# Patient Record
Sex: Female | Born: 1970 | Race: White | Hispanic: No | Marital: Married | Smoking: Never smoker
Health system: Southern US, Community
[De-identification: ages and names within clinical notes are randomized; demographics above are authoritative.]

## PROBLEM LIST (undated history)

## (undated) DIAGNOSIS — D649 Anemia, unspecified: Secondary | ICD-10-CM

## (undated) HISTORY — PX: ABDOMINAL HYSTERECTOMY: SHX81

---

## 2000-11-08 HISTORY — PX: DILATION AND CURETTAGE OF UTERUS: SHX78

## 2017-10-24 ENCOUNTER — Other Ambulatory Visit: Payer: Self-pay | Admitting: Family Medicine

## 2017-10-24 DIAGNOSIS — Z1231 Encounter for screening mammogram for malignant neoplasm of breast: Secondary | ICD-10-CM

## 2017-11-16 ENCOUNTER — Encounter: Payer: Self-pay | Admitting: Radiology

## 2017-11-16 ENCOUNTER — Ambulatory Visit
Admission: RE | Admit: 2017-11-16 | Discharge: 2017-11-16 | Disposition: A | Discharge status: Home / Self Care | Payer: BLUE CROSS/BLUE SHIELD | Source: Ambulatory Visit | Attending: Family Medicine | Admitting: Family Medicine

## 2017-11-16 DIAGNOSIS — R928 Other abnormal and inconclusive findings on diagnostic imaging of breast: Secondary | ICD-10-CM | POA: Insufficient documentation

## 2017-11-16 DIAGNOSIS — N6489 Other specified disorders of breast: Secondary | ICD-10-CM | POA: Insufficient documentation

## 2017-11-16 DIAGNOSIS — Z1231 Encounter for screening mammogram for malignant neoplasm of breast: Secondary | ICD-10-CM | POA: Diagnosis not present

## 2017-11-23 ENCOUNTER — Other Ambulatory Visit: Payer: Self-pay | Admitting: Family Medicine

## 2017-11-23 DIAGNOSIS — R928 Other abnormal and inconclusive findings on diagnostic imaging of breast: Secondary | ICD-10-CM

## 2017-11-23 DIAGNOSIS — N6489 Other specified disorders of breast: Secondary | ICD-10-CM

## 2017-11-24 ENCOUNTER — Ambulatory Visit
Admission: RE | Admit: 2017-11-24 | Discharge: 2017-11-24 | Disposition: A | Discharge status: Home / Self Care | Payer: BLUE CROSS/BLUE SHIELD | Source: Ambulatory Visit | Attending: Family Medicine | Admitting: Family Medicine

## 2017-11-24 DIAGNOSIS — N6489 Other specified disorders of breast: Secondary | ICD-10-CM

## 2017-11-24 DIAGNOSIS — R928 Other abnormal and inconclusive findings on diagnostic imaging of breast: Secondary | ICD-10-CM

## 2020-12-02 ENCOUNTER — Other Ambulatory Visit: Payer: Self-pay | Admitting: Family Medicine

## 2020-12-02 DIAGNOSIS — R102 Pelvic and perineal pain: Secondary | ICD-10-CM

## 2020-12-05 ENCOUNTER — Ambulatory Visit
Admission: RE | Admit: 2020-12-05 | Discharge: 2020-12-05 | Disposition: A | Discharge status: Home / Self Care | Payer: BC Managed Care – PPO | Source: Ambulatory Visit | Attending: Family Medicine | Admitting: Family Medicine

## 2020-12-05 ENCOUNTER — Ambulatory Visit
Admission: RE | Admit: 2020-12-05 | Discharge: 2020-12-05 | Disposition: A | Discharge status: Home / Self Care | Payer: BLUE CROSS/BLUE SHIELD | Source: Ambulatory Visit | Attending: Family Medicine | Admitting: Family Medicine

## 2020-12-05 ENCOUNTER — Other Ambulatory Visit: Payer: Self-pay

## 2020-12-05 DIAGNOSIS — R102 Pelvic and perineal pain: Secondary | ICD-10-CM | POA: Diagnosis not present

## 2021-01-05 ENCOUNTER — Other Ambulatory Visit: Payer: Self-pay

## 2021-01-05 ENCOUNTER — Ambulatory Visit
Admission: RE | Admit: 2021-01-05 | Discharge: 2021-01-05 | Disposition: A | Discharge status: Home / Self Care | Payer: BC Managed Care – PPO | Source: Ambulatory Visit | Attending: Obstetrics and Gynecology | Admitting: Obstetrics and Gynecology

## 2021-01-05 DIAGNOSIS — N920 Excessive and frequent menstruation with regular cycle: Secondary | ICD-10-CM | POA: Insufficient documentation

## 2021-01-05 DIAGNOSIS — O034 Incomplete spontaneous abortion without complication: Secondary | ICD-10-CM | POA: Diagnosis present

## 2021-01-05 DIAGNOSIS — D259 Leiomyoma of uterus, unspecified: Secondary | ICD-10-CM | POA: Diagnosis not present

## 2021-01-05 MED ORDER — SODIUM CHLORIDE 0.9 % IV SOLN
300.0000 mg | INTRAVENOUS | Status: DC
  Administered 2021-01-05: 300 mg via INTRAVENOUS
  Filled 2021-01-05: qty 15

## 2021-01-06 ENCOUNTER — Other Ambulatory Visit: Payer: Self-pay | Admitting: Obstetrics and Gynecology

## 2021-01-12 ENCOUNTER — Other Ambulatory Visit: Payer: Self-pay

## 2021-01-12 ENCOUNTER — Ambulatory Visit: Payer: BC Managed Care – PPO

## 2021-01-12 ENCOUNTER — Ambulatory Visit
Admission: RE | Admit: 2021-01-12 | Discharge: 2021-01-12 | Disposition: A | Discharge status: Home / Self Care | Payer: BC Managed Care – PPO | Source: Ambulatory Visit | Attending: Obstetrics and Gynecology | Admitting: Obstetrics and Gynecology

## 2021-01-12 DIAGNOSIS — D509 Iron deficiency anemia, unspecified: Secondary | ICD-10-CM | POA: Diagnosis present

## 2021-01-12 MED ORDER — SODIUM CHLORIDE 0.9 % IV SOLN
300.0000 mg | INTRAVENOUS | Status: DC
  Administered 2021-01-12: 300 mg via INTRAVENOUS
  Filled 2021-01-12: qty 15

## 2021-01-12 NOTE — H&P (Signed)
Debra James is a 50 y.o. female here forTAH and bilateral salpingectomy  2 month h/o of lower abd mass noted . + increase heavy menses . +/- clots  Pain / cramping sometimes will double her over.  Cbc 12/02/20: Hct 29.6 , mcv 69 Some dyspareunia   EMBX : 2/22 proliferative   Pap : neg    hemoglobin last 8.7 ( since she has received iron transfusions ) Ferritin 3  U/s  EXAM: TRANSABDOMINAL AND TRANSVAGINAL ULTRASOUND OF PELVIS  TECHNIQUE: Both transabdominal and transvaginal ultrasound examinations of the pelvis were performed. Transabdominal technique was performed for global imaging of the pelvis including uterus, ovaries, adnexal regions, and pelvic cul-de-sac. It was necessary to proceed with endovaginal exam following the transabdominal exam to visualize the endometrium.  COMPARISON: None  FINDINGS: Uterus  Measurements: 17.8 x 14.2 x 14.0 cm = volume: 1851 mL. Anteverted. Enlarged and heterogeneous. Large heterogeneous mass identified at the mid upper uterine segments posteriorly 12.2 x 11.3 x 12.5 cm consistent with a large transmural leiomyoma. Displacement and compression of the remainder of the uterus including endometrial complex anteriorly.  Endometrium  Nonvisualized due to compression and displacement by large uterine mass  Right ovary  Measurements: 6.5 x 4.1 x 6.2 cm = volume: 86 mL. Cyst within RIGHT ovary 5.5 x 3.5 x 5.8 cm containing a thin septation. No definite mural nodularity.  Left ovary  Measurements: 3.2 x 1.4 x 2.6 cm = volume: 5.8 mL. Normal morphology without mass.  Other findings  No free pelvic fluid. No other pelvic masses.    Past Medical History:  has a past medical history of Allergic state.  Past Surgical History:  has a past surgical history that includes Dilation and curettage of uterus. Family History: family history includes Diabetes type II in her mother; Epilepsy in her brother; Heart failure in her  maternal grandmother, paternal grandfather, and paternal grandmother; High blood pressure (Hypertension) in her brother; Hyperlipidemia (Elevated cholesterol) in her father; Myocardial Infarction (Heart attack) in her maternal grandfather; Tuberculosis in her paternal grandfather. Social History:  reports that she has never smoked. She has never used smokeless tobacco. She reports current alcohol use. She reports that she does not use drugs. OB/GYN History:          OB History    Gravida  5   Para  4   Term      Preterm      AB  1   Living  4     SAB      IAB      Ectopic      Molar      Multiple      Live Births  4          Allergies: is allergic to penicillin. Medications:  Current Outpatient Medications:  .  tranexamic acid (LYSTEDA) 650 mg tablet, Take 2 tablets (1,300 mg total) by mouth 3 (three) times daily Take for a maximum of 5 days during monthly menstruation., Disp: 30 tablet, Rfl: 3  Review of Systems: General:                      No fatigue or weight loss Eyes:                           No vision changes Ears:  No hearing difficulty Respiratory:                No cough or shortness of breath Pulmonary:                  No asthma or shortness of breath Cardiovascular:           No chest pain, palpitations, dyspnea on exertion Gastrointestinal:          No abdominal bloating, chronic diarrhea, constipations, masses, pain or hematochezia Genitourinary:             No hematuria, dysuria, abnormal vaginal discharge, pelvic pain, Menometrorrhagia Lymphatic:                   No swollen lymph nodes Musculoskeletal:         No muscle weakness Neurologic:                  No extremity weakness, syncope, seizure disorder Psychiatric:                  No history of depression, delusions or suicidal/homicidal ideation    Exam:      Vitals:   01/12/21   BP: 110/71  Pulse: 89    Body mass index is 28.51  kg/m.  WDWN white/ female in NAD   Lungs: CTA  CV : RRR without murmur   Neck:  no thyromegaly Abdomen: soft , no mass, normal active bowel sounds,  non-tender, no rebound tenderness, uterus to umbilicus   Pelvic: tanner stage 5 ,  External genitalia: vulva /labia no lesions Urethra: no prolapse Vagina: normal physiologic d/c Cervix: high ,no lesions, no cervical motion tenderness   Uterus:20 week in size Adnexa: no mass,  non-tender   Rectovaginal:   Impression:   The primary encounter diagnosis was Intramural leiomyoma of uterus. Diagnoses of Ovarian cyst, right, Menorrhagia with irregular cycle, Routine cervical smear, and Iron deficiency anemia due to chronic blood loss were also pertinent to this visit.    Plan:   I spoke to her about treatment option TAH vs [place] ( done by IR - she mentioned ). Lupron injection to reduce volume then surgery . After consideration she elects for TAH / bilateral salpingectomy . Possible right ovarian cystectomy   She has received iron transfusions   risk of the procedure have been discussed with the patient  ( See [facility] notes)         Caroline Sauger, MD

## 2021-01-20 ENCOUNTER — Other Ambulatory Visit
Admission: RE | Admit: 2021-01-20 | Discharge: 2021-01-20 | Disposition: A | Discharge status: Home / Self Care | Payer: BC Managed Care – PPO | Source: Ambulatory Visit

## 2021-01-20 ENCOUNTER — Other Ambulatory Visit: Payer: Self-pay

## 2021-01-20 ENCOUNTER — Ambulatory Visit
Admission: RE | Admit: 2021-01-20 | Discharge: 2021-01-20 | Disposition: A | Discharge status: Home / Self Care | Payer: BC Managed Care – PPO | Source: Ambulatory Visit | Attending: Obstetrics and Gynecology | Admitting: Obstetrics and Gynecology

## 2021-01-20 DIAGNOSIS — D500 Iron deficiency anemia secondary to blood loss (chronic): Secondary | ICD-10-CM | POA: Diagnosis not present

## 2021-01-20 HISTORY — DX: Anemia, unspecified: D64.9

## 2021-01-20 MED ORDER — SODIUM CHLORIDE 0.9 % IV SOLN
300.0000 mg | Freq: Once | INTRAVENOUS | Status: AC
  Administered 2021-01-20: 300 mg via INTRAVENOUS
  Filled 2021-01-20: qty 15

## 2021-01-20 MED ORDER — SODIUM CHLORIDE 0.9 % IV SOLN
300.0000 mg | Freq: Once | INTRAVENOUS | Status: DC
  Filled 2021-01-20 (×2): qty 15

## 2021-01-20 NOTE — Patient Instructions (Signed)
Your procedure is scheduled on: Thursday January 29, 2021. Report to Day Surgery inside [facility] 2nd floor (stop by Admissions desk first before getting on Elevator). To find out your arrival time please call [phone] between 1PM - 3PM on Wednesday January 28, 2021.  Remember: Instructions that are not followed completely may result in serious medical risk,  up to and including death, or upon the discretion of your surgeon and anesthesiologist your  surgery may need to be rescheduled.     _X__ 1. Do not eat food after midnight the night before your procedure.                 No chewing gum or hard candies. You may drink clear liquids up to 2 hours                 before you are scheduled to arrive for your surgery- DO not drink clear                 liquids within 2 hours of the start of your surgery.                 Clear Liquids include:  water, apple juice without pulp, clear Gatorade, G2 or                  Gatorade Zero (avoid Red/Purple/Blue), Black Coffee or Tea (Do not add                 anything to coffee or tea).  __X__2.  On the morning of surgery brush your teeth with toothpaste and water, you                may rinse your mouth with mouthwash if you wish.  Do not swallow any toothpaste of mouthwash.     _X__ 3.  No Alcohol for 24 hours before or after surgery.   _X__ 4.  Do Not Smoke or use e-cigarettes For 24 Hours Prior to Your Surgery.                 Do not use any chewable tobacco products for at least 6 hours prior to                 Surgery.  _X__  5.  Do not use any recreational drugs (marijuana, cocaine, heroin, ecstasy, MDMA or other)                For at least one week prior to your surgery.  Combination of these drugs with anesthesia                May have life threatening results.  __X__ 6.  Notify your doctor if there is any change in your medical condition      (cold, fever, infections).     Do not wear jewelry, make-up,  hairpins, clips or nail polish. Do not wear lotions, powders, or perfumes. You may wear deodorant. Do not shave 48 hours prior to surgery. Men may shave face and neck. Do not bring valuables to the hospital.    [facility] is not responsible for any belongings or valuables.  Contacts, dentures or bridgework may not be worn into surgery. Leave your suitcase in the car. After surgery it may be brought to your room. For patients admitted to the hospital, discharge time is determined by your treatment team.   Patients discharged the day of surgery will not be allowed to drive  home.   Make arrangements for someone to be with you for the first 24 hours of your Same Day Discharge.    __X__ Take these medicines the morning of surgery with A SIP OF WATER:    1. None   2.   3.   4.  5.  6.  ____ Fleet Enema (as directed)   __X__ Use CHG Soap (or wipes) as directed  ____ Use Benzoyl Peroxide Gel as instructed  __X__ Use inhalers on the day of surgery  fluticasone (FLONASE) 50 MCG/ACT nasal spray  ____ Stop metformin 2 days prior to surgery    __X__ Stop Anti-inflammatories such as ibuprofen (ADVIL), Aleve, naproxen, aspirin and or BC powders.   __X__ Stop supplements until after surgery.    ____  Do not start any herbal supplements before your procedure.    If you have any questions regarding your pre-procedure instructions,  Please call Pre-admit Testing at [phone].

## 2021-01-27 ENCOUNTER — Other Ambulatory Visit: Payer: BC Managed Care – PPO

## 2021-01-27 ENCOUNTER — Other Ambulatory Visit: Payer: Self-pay

## 2021-01-27 ENCOUNTER — Other Ambulatory Visit
Admission: RE | Admit: 2021-01-27 | Discharge: 2021-01-27 | Disposition: A | Discharge status: Home / Self Care | Payer: BC Managed Care – PPO | Source: Ambulatory Visit | Attending: Obstetrics and Gynecology | Admitting: Obstetrics and Gynecology

## 2021-01-27 DIAGNOSIS — Z01812 Encounter for preprocedural laboratory examination: Secondary | ICD-10-CM | POA: Insufficient documentation

## 2021-01-27 DIAGNOSIS — Z20822 Contact with and (suspected) exposure to covid-19: Secondary | ICD-10-CM | POA: Insufficient documentation

## 2021-01-27 LAB — SARS CORONAVIRUS 2 (TAT 6-24 HRS): SARS Coronavirus 2: NEGATIVE

## 2021-01-27 LAB — BASIC METABOLIC PANEL
Anion gap: 8 (ref 5–15)
BUN: 14 mg/dL (ref 6–20)
CO2: 25 mmol/L (ref 22–32)
Calcium: 8.9 mg/dL (ref 8.9–10.3)
Chloride: 103 mmol/L (ref 98–111)
Creatinine, Ser: 0.83 mg/dL (ref 0.44–1.00)
GFR, Estimated: >60 mL/min (ref >60)
Glucose, Bld: 76 mg/dL (ref 70–99)
Potassium: 3.7 mmol/L (ref 3.5–5.1)
Sodium: 136 mmol/L (ref 135–145)

## 2021-01-27 LAB — TYPE AND SCREEN
ABO/RH(D): A NEG
Antibody Screen: NEGATIVE

## 2021-01-27 LAB — CBC
HCT: 36 % (ref 36.0–46.0)
Hemoglobin: 10.7 g/dL — ABNORMAL LOW (ref 12.0–15.0)
MCH: 22.1 pg — ABNORMAL LOW (ref 26.0–34.0)
MCHC: 29.7 g/dL — ABNORMAL LOW (ref 30.0–36.0)
MCV: 74.4 fL — ABNORMAL LOW (ref 80.0–100.0)
Platelets: 453 10*3/uL — ABNORMAL HIGH (ref 150–400)
RBC: 4.84 MIL/uL (ref 3.87–5.11)
RDW: 27.2 % — ABNORMAL HIGH (ref 11.5–15.5)
WBC: 6.9 10*3/uL (ref 4.0–10.5)
nRBC: 0 % (ref 0.0–0.2)

## 2021-01-29 ENCOUNTER — Other Ambulatory Visit: Payer: Self-pay

## 2021-01-29 ENCOUNTER — Inpatient Hospital Stay
Admission: RE | Admit: 2021-01-29 | Discharge: 2021-01-31 | DRG: 743 | Disposition: A | Discharge status: Home / Self Care | Payer: BC Managed Care – PPO | Attending: Obstetrics and Gynecology | Admitting: Obstetrics and Gynecology

## 2021-01-29 ENCOUNTER — Encounter
Admission: RE | Disposition: A | Discharge status: Home / Self Care | Payer: Self-pay | Source: Home / Self Care | Attending: Obstetrics and Gynecology

## 2021-01-29 ENCOUNTER — Inpatient Hospital Stay: Payer: BC Managed Care – PPO

## 2021-01-29 ENCOUNTER — Encounter: Payer: Self-pay | Admitting: Obstetrics and Gynecology

## 2021-01-29 DIAGNOSIS — D251 Intramural leiomyoma of uterus: Principal | ICD-10-CM | POA: Diagnosis present

## 2021-01-29 DIAGNOSIS — D500 Iron deficiency anemia secondary to blood loss (chronic): Secondary | ICD-10-CM | POA: Diagnosis present

## 2021-01-29 DIAGNOSIS — N83202 Unspecified ovarian cyst, left side: Secondary | ICD-10-CM | POA: Diagnosis present

## 2021-01-29 DIAGNOSIS — N920 Excessive and frequent menstruation with regular cycle: Secondary | ICD-10-CM | POA: Diagnosis present

## 2021-01-29 DIAGNOSIS — Z20822 Contact with and (suspected) exposure to covid-19: Secondary | ICD-10-CM | POA: Diagnosis present

## 2021-01-29 DIAGNOSIS — Z9889 Other specified postprocedural states: Secondary | ICD-10-CM

## 2021-01-29 HISTORY — PX: OVARIAN CYST REMOVAL: SHX89

## 2021-01-29 HISTORY — PX: HYSTERECTOMY ABDOMINAL WITH SALPINGECTOMY: SHX6725

## 2021-01-29 LAB — ABO/RH: ABO/RH(D): A NEG

## 2021-01-29 LAB — POCT PREGNANCY, URINE
Preg Test, Ur: NEGATIVE
Preg Test, Ur: NEGATIVE
Urine-Other: NEGATIVE

## 2021-01-29 SURGERY — HYSTERECTOMY, TOTAL, ABDOMINAL, WITH SALPINGECTOMY
Anesthesia: General | Laterality: Left

## 2021-01-29 MED ORDER — ROCURONIUM BROMIDE 100 MG/10ML IV SOLN
INTRAVENOUS | Status: DC | PRN
  Administered 2021-01-29 (×3): 50 mg via INTRAVENOUS

## 2021-01-29 MED ORDER — LIDOCAINE HCL (PF) 2 % IJ SOLN
INTRAMUSCULAR | Status: AC
  Filled 2021-01-29: qty 5

## 2021-01-29 MED ORDER — PHENYLEPHRINE HCL (PRESSORS) 10 MG/ML IV SOLN
INTRAVENOUS | Status: AC
  Filled 2021-01-29: qty 1

## 2021-01-29 MED ORDER — BUPIVACAINE HCL (PF) 0.5 % IJ SOLN
INTRAMUSCULAR | Status: AC
  Filled 2021-01-29: qty 30

## 2021-01-29 MED ORDER — NALOXONE HCL 0.4 MG/ML IJ SOLN
0.4000 mg | INTRAMUSCULAR | Status: DC | PRN
  Filled 2021-01-29: qty 1

## 2021-01-29 MED ORDER — DEXAMETHASONE SODIUM PHOSPHATE 10 MG/ML IJ SOLN
INTRAMUSCULAR | Status: AC
  Filled 2021-01-29: qty 1

## 2021-01-29 MED ORDER — SODIUM CHLORIDE 0.9% FLUSH: 9.0000 mL | INTRAVENOUS | Status: DC | PRN

## 2021-01-29 MED ORDER — ONDANSETRON HCL 4 MG/2ML IJ SOLN: 4.0000 mg | Freq: Once | INTRAMUSCULAR | Status: DC | PRN

## 2021-01-29 MED ORDER — GABAPENTIN 300 MG PO CAPS: 300.0000 mg | ORAL_CAPSULE | ORAL | Status: AC

## 2021-01-29 MED ORDER — LIDOCAINE HCL (CARDIAC) PF 100 MG/5ML IV SOSY
PREFILLED_SYRINGE | INTRAVENOUS | Status: DC | PRN
  Administered 2021-01-29: 80 mg via INTRAVENOUS

## 2021-01-29 MED ORDER — PHENYLEPHRINE HCL (PRESSORS) 10 MG/ML IV SOLN
INTRAVENOUS | Status: DC | PRN
  Administered 2021-01-29 (×2): 50 ug via INTRAVENOUS
  Administered 2021-01-29 (×2): 100 ug via INTRAVENOUS

## 2021-01-29 MED ORDER — FENTANYL CITRATE (PF) 100 MCG/2ML IJ SOLN
INTRAMUSCULAR | Status: AC
  Filled 2021-01-29: qty 2

## 2021-01-29 MED ORDER — SODIUM CHLORIDE (PF) 0.9 % IJ SOLN
INTRAMUSCULAR | Status: AC
  Filled 2021-01-29: qty 50

## 2021-01-29 MED ORDER — CEFAZOLIN SODIUM-DEXTROSE 2-4 GM/100ML-% IV SOLN
INTRAVENOUS | Status: AC
  Filled 2021-01-29: qty 100

## 2021-01-29 MED ORDER — ONDANSETRON HCL 4 MG/2ML IJ SOLN
INTRAMUSCULAR | Status: AC
  Filled 2021-01-29: qty 2

## 2021-01-29 MED ORDER — IBUPROFEN 800 MG PO TABS: 800.0000 mg | ORAL_TABLET | Freq: Four times a day (QID) | ORAL | Status: DC

## 2021-01-29 MED ORDER — PROPOFOL 500 MG/50ML IV EMUL
INTRAVENOUS | Status: DC | PRN
  Administered 2021-01-29: 150 ug/kg/min via INTRAVENOUS

## 2021-01-29 MED ORDER — CEFAZOLIN SODIUM-DEXTROSE 2-4 GM/100ML-% IV SOLN
2.0000 g | Freq: Once | INTRAVENOUS | Status: AC
  Administered 2021-01-29: 2 g via INTRAVENOUS

## 2021-01-29 MED ORDER — ROCURONIUM BROMIDE 10 MG/ML (PF) SYRINGE
PREFILLED_SYRINGE | INTRAVENOUS | Status: AC
  Filled 2021-01-29: qty 10

## 2021-01-29 MED ORDER — ONDANSETRON HCL 4 MG/2ML IJ SOLN
INTRAMUSCULAR | Status: DC | PRN
  Administered 2021-01-29: 4 mg via INTRAVENOUS

## 2021-01-29 MED ORDER — PROPOFOL 10 MG/ML IV BOLUS
INTRAVENOUS | Status: AC
  Filled 2021-01-29: qty 20

## 2021-01-29 MED ORDER — DEXAMETHASONE SODIUM PHOSPHATE 10 MG/ML IJ SOLN
INTRAMUSCULAR | Status: DC | PRN
  Administered 2021-01-29: 10 mg via INTRAVENOUS

## 2021-01-29 MED ORDER — DIPHENHYDRAMINE HCL 50 MG/ML IJ SOLN: 12.5000 mg | Freq: Four times a day (QID) | INTRAMUSCULAR | Status: DC | PRN

## 2021-01-29 MED ORDER — POVIDONE-IODINE 10 % EX SWAB
2.0000 "application " | Freq: Once | CUTANEOUS | Status: AC
  Administered 2021-01-29: 2 via TOPICAL

## 2021-01-29 MED ORDER — ACETAMINOPHEN 10 MG/ML IV SOLN: 1000.0000 mg | Freq: Once | INTRAVENOUS | Status: DC | PRN

## 2021-01-29 MED ORDER — SODIUM CHLORIDE FLUSH 0.9 % IV SOLN
INTRAVENOUS | Status: DC | PRN
  Administered 2021-01-29: 80 mL

## 2021-01-29 MED ORDER — FAMOTIDINE 20 MG PO TABS
ORAL_TABLET | ORAL | Status: AC
  Administered 2021-01-29: 20 mg via ORAL
  Filled 2021-01-29: qty 1

## 2021-01-29 MED ORDER — ACETAMINOPHEN 500 MG PO TABS
ORAL_TABLET | ORAL | Status: AC
  Filled 2021-01-29: qty 2

## 2021-01-29 MED ORDER — ONDANSETRON HCL 4 MG PO TABS: 4.0000 mg | ORAL_TABLET | Freq: Four times a day (QID) | ORAL | Status: DC | PRN

## 2021-01-29 MED ORDER — KETOROLAC TROMETHAMINE 30 MG/ML IJ SOLN
30.0000 mg | Freq: Four times a day (QID) | INTRAMUSCULAR | Status: DC
  Administered 2021-01-29 – 2021-01-30 (×3): 30 mg via INTRAVENOUS
  Filled 2021-01-29 (×3): qty 1

## 2021-01-29 MED ORDER — ONDANSETRON HCL 4 MG/2ML IJ SOLN: 4.0000 mg | Freq: Four times a day (QID) | INTRAMUSCULAR | Status: DC | PRN

## 2021-01-29 MED ORDER — PROPOFOL 10 MG/ML IV BOLUS
INTRAVENOUS | Status: DC | PRN
Start: 2021-01-29 — End: 2021-01-29
  Administered 2021-01-29: 150 mg via INTRAVENOUS

## 2021-01-29 MED ORDER — MIDAZOLAM HCL 2 MG/2ML IJ SOLN
INTRAMUSCULAR | Status: DC | PRN
  Administered 2021-01-29: 2 mg via INTRAVENOUS

## 2021-01-29 MED ORDER — ACETAMINOPHEN 500 MG PO TABS
1000.0000 mg | ORAL_TABLET | Freq: Four times a day (QID) | ORAL | Status: DC
  Administered 2021-01-30 (×3): 1000 mg via ORAL
  Filled 2021-01-29 (×3): qty 2

## 2021-01-29 MED ORDER — CEFAZOLIN (ANCEF) 1 G IV SOLR: 1.0000 g | INTRAVENOUS | Status: DC

## 2021-01-29 MED ORDER — CHLORHEXIDINE GLUCONATE 0.12 % MT SOLN
OROMUCOSAL | Status: AC
  Filled 2021-01-29: qty 15

## 2021-01-29 MED ORDER — PROPOFOL 500 MG/50ML IV EMUL
INTRAVENOUS | Status: AC
  Filled 2021-01-29: qty 50

## 2021-01-29 MED ORDER — LACTATED RINGERS IV SOLN: INTRAVENOUS | Status: DC

## 2021-01-29 MED ORDER — FENTANYL CITRATE (PF) 100 MCG/2ML IJ SOLN
25.0000 ug | INTRAMUSCULAR | Status: DC | PRN
  Administered 2021-01-29: 50 ug via INTRAVENOUS
  Administered 2021-01-29 (×2): 25 ug via INTRAVENOUS

## 2021-01-29 MED ORDER — ORAL CARE MOUTH RINSE: 15.0000 mL | Freq: Once | OROMUCOSAL | Status: AC

## 2021-01-29 MED ORDER — OXYCODONE HCL 5 MG PO TABS: 5.0000 mg | ORAL_TABLET | Freq: Once | ORAL | Status: DC | PRN

## 2021-01-29 MED ORDER — GABAPENTIN 300 MG PO CAPS
ORAL_CAPSULE | ORAL | Status: AC
  Administered 2021-01-29: 300 mg via ORAL
  Filled 2021-01-29: qty 1

## 2021-01-29 MED ORDER — CHLORHEXIDINE GLUCONATE 0.12 % MT SOLN
15.0000 mL | Freq: Once | OROMUCOSAL | Status: AC
  Administered 2021-01-29: 15 mL via OROMUCOSAL

## 2021-01-29 MED ORDER — MORPHINE SULFATE 1 MG/ML IV SOLN PCA
INTRAVENOUS | Status: DC
Start: 2021-01-29 — End: 2021-01-30
  Administered 2021-01-29: 2 mg via INTRAVENOUS
  Administered 2021-01-30: 3 mg via INTRAVENOUS
  Filled 2021-01-29 (×2): qty 30

## 2021-01-29 MED ORDER — BUPIVACAINE LIPOSOME 1.3 % IJ SUSP
INTRAMUSCULAR | Status: AC
  Filled 2021-01-29: qty 20

## 2021-01-29 MED ORDER — FENTANYL CITRATE (PF) 100 MCG/2ML IJ SOLN
INTRAMUSCULAR | Status: AC
  Administered 2021-01-29: 25 ug via INTRAVENOUS
  Filled 2021-01-29: qty 2

## 2021-01-29 MED ORDER — MIDAZOLAM HCL 2 MG/2ML IJ SOLN
INTRAMUSCULAR | Status: AC
  Filled 2021-01-29: qty 2

## 2021-01-29 MED ORDER — SUGAMMADEX SODIUM 200 MG/2ML IV SOLN
INTRAVENOUS | Status: DC | PRN
  Administered 2021-01-29: 200 mg via INTRAVENOUS

## 2021-01-29 MED ORDER — ACETAMINOPHEN 500 MG PO TABS
1000.0000 mg | ORAL_TABLET | Freq: Four times a day (QID) | ORAL | Status: DC
  Administered 2021-01-29 (×2): 1000 mg via ORAL
  Filled 2021-01-29 (×2): qty 2

## 2021-01-29 MED ORDER — OXYCODONE HCL 5 MG/5ML PO SOLN: 5.0000 mg | Freq: Once | ORAL | Status: DC | PRN

## 2021-01-29 MED ORDER — KETOROLAC TROMETHAMINE 30 MG/ML IJ SOLN
INTRAMUSCULAR | Status: AC
  Filled 2021-01-29: qty 1

## 2021-01-29 MED ORDER — GABAPENTIN 300 MG PO CAPS
300.0000 mg | ORAL_CAPSULE | Freq: Every day | ORAL | Status: DC
  Filled 2021-01-29: qty 1

## 2021-01-29 MED ORDER — FAMOTIDINE 20 MG PO TABS: 20.0000 mg | ORAL_TABLET | Freq: Once | ORAL | Status: AC

## 2021-01-29 MED ORDER — KETOROLAC TROMETHAMINE 30 MG/ML IJ SOLN
INTRAMUSCULAR | Status: DC | PRN
  Administered 2021-01-29: 30 mg via INTRAVENOUS

## 2021-01-29 MED ORDER — FENTANYL CITRATE (PF) 100 MCG/2ML IJ SOLN
INTRAMUSCULAR | Status: DC | PRN
  Administered 2021-01-29 (×2): 50 ug via INTRAVENOUS

## 2021-01-29 MED ORDER — DIPHENHYDRAMINE HCL 12.5 MG/5ML PO ELIX
12.5000 mg | ORAL_SOLUTION | Freq: Four times a day (QID) | ORAL | Status: DC | PRN
Start: 2021-01-29 — End: 2021-01-30
  Filled 2021-01-29: qty 5

## 2021-01-29 MED ORDER — ACETAMINOPHEN 500 MG PO TABS
1000.0000 mg | ORAL_TABLET | ORAL | Status: AC
  Administered 2021-01-29: 1000 mg via ORAL

## 2021-01-29 SURGICAL SUPPLY — 43 items
CHLORAPREP W/TINT 26 (MISCELLANEOUS) ×3 IMPLANT
COUNTER NEEDLE 1200 MAGNETIC (NEEDLE) ×3 IMPLANT
COVER WAND RF STERILE (DRAPES) ×3 IMPLANT
DRAPE LAP W/FLUID (DRAPES) ×3 IMPLANT
DRAPE UNDER BUTTOCK W/FLU (DRAPES) ×3 IMPLANT
DRSG TELFA 3X8 NADH (GAUZE/BANDAGES/DRESSINGS) ×3 IMPLANT
ELECT BLADE 6.5 EXT (BLADE) ×3 IMPLANT
ELECT REM PT RETURN 9FT ADLT (ELECTROSURGICAL) ×3
ELECTRODE REM PT RTRN 9FT ADLT (ELECTROSURGICAL) ×2 IMPLANT
GAUZE 4X4 16PLY RFD (DISPOSABLE) ×3 IMPLANT
GAUZE SPONGE 4X4 12PLY STRL (GAUZE/BANDAGES/DRESSINGS) ×3 IMPLANT
GLOVE SURG ENC MOIS LTX SZ7 (GLOVE) ×3 IMPLANT
GLOVE SURG SYN 8.0 (GLOVE) ×3 IMPLANT
GLOVE SURG UNDER POLY LF SZ6.5 (GLOVE) ×3 IMPLANT
GOWN STRL REUS W/ TWL LRG LVL3 (GOWN DISPOSABLE) ×8 IMPLANT
GOWN STRL REUS W/ TWL XL LVL3 (GOWN DISPOSABLE) ×2 IMPLANT
GOWN STRL REUS W/TWL LRG LVL3 (GOWN DISPOSABLE) ×4
GOWN STRL REUS W/TWL XL LVL3 (GOWN DISPOSABLE) ×1
KIT TURNOVER CYSTO (KITS) ×3 IMPLANT
LABEL OR SOLS (LABEL) ×3 IMPLANT
MANIFOLD NEPTUNE II (INSTRUMENTS) ×3 IMPLANT
NEEDLE HYPO 22GX1.5 SAFETY (NEEDLE) ×6 IMPLANT
PACK BASIN MAJOR ARMC (MISCELLANEOUS) ×3 IMPLANT
PENCIL SMOKE EVACUATOR (MISCELLANEOUS) ×3 IMPLANT
RETAINER VISCERA MED (MISCELLANEOUS) IMPLANT
SET CYSTO W/LG BORE CLAMP LF (SET/KITS/TRAYS/PACK) ×3 IMPLANT
SOL PREP PVP 2OZ (MISCELLANEOUS) ×3
SOLUTION PREP PVP 2OZ (MISCELLANEOUS) ×2 IMPLANT
SPONGE KITTNER 5P (MISCELLANEOUS) ×3 IMPLANT
STAPLER INSORB 30 2030 C-SECTI (MISCELLANEOUS) IMPLANT
STAPLER SKIN PROX 35W (STAPLE) IMPLANT
SURGILUBE 2OZ TUBE FLIPTOP (MISCELLANEOUS) ×3 IMPLANT
SUT PDS AB 1 TP1 96 (SUTURE) ×6 IMPLANT
SUT VIC AB 0 CT1 27 (SUTURE) ×3
SUT VIC AB 0 CT1 27XCR 8 STRN (SUTURE) ×6 IMPLANT
SUT VIC AB 0 CT1 36 (SUTURE) ×6 IMPLANT
SUT VIC AB 2-0 SH 27 (SUTURE) ×4
SUT VIC AB 2-0 SH 27XBRD (SUTURE) ×8 IMPLANT
SYR 20ML LL LF (SYRINGE) ×6 IMPLANT
SYR 30ML LL (SYRINGE) ×3 IMPLANT
SYR BULB IRRIG 60ML STRL (SYRINGE) ×3 IMPLANT
TRAY FOLEY MTR SLVR 16FR STAT (SET/KITS/TRAYS/PACK) ×3 IMPLANT
WATER STERILE IRR 1000ML POUR (IV SOLUTION) ×3 IMPLANT

## 2021-01-29 NOTE — Progress Notes (Signed)
Pt ready for TAH , possible right ovarian cystectomy  Labs reviewed . NPO . All questions answered . Proceed

## 2021-01-29 NOTE — Anesthesia Postprocedure Evaluation (Signed)
Anesthesia Post Note  Patient: Debra James  Procedure(s) Performed: HYSTERECTOMY ABDOMINAL WITH SALPINGECTOMY (Bilateral ) OVARIAN CYSTECTOMY (Left )  Patient location during evaluation: PACU Anesthesia Type: General Level of consciousness: awake and alert Pain management: pain level controlled Vital Signs Assessment: post-procedure vital signs reviewed and stable Respiratory status: spontaneous breathing, nonlabored ventilation, respiratory function stable and patient connected to nasal cannula oxygen Cardiovascular status: blood pressure returned to baseline and stable Postop Assessment: no apparent nausea or vomiting Anesthetic complications: no   No complications documented.   Last Vitals:  Vitals:   01/29/21 1048 01/29/21 1053  BP:    Pulse: 63 65  Resp: 14 13  Temp:    SpO2: 97% 95%    Last Pain:  Vitals:   01/29/21 1053  TempSrc:   PainSc: 7                  Arita Miss

## 2021-01-29 NOTE — Transfer of Care (Signed)
Immediate Anesthesia Transfer of Care Note  Patient: Debra James  Procedure(s) Performed: HYSTERECTOMY ABDOMINAL WITH SALPINGECTOMY (Bilateral ) OVARIAN CYSTECTOMY (Left )  Patient Location: PACU  Anesthesia Type:General  Level of Consciousness: drowsy and patient cooperative  Airway & Oxygen Therapy: Patient Spontanous Breathing and Patient connected to face mask oxygen  Post-op Assessment: Report given to RN, Post -op Vital signs reviewed and stable and Patient moving all extremities  Post vital signs: Reviewed and stable  Last Vitals:  Vitals Value Taken Time  BP 116/70 01/29/21 1025  Temp    Pulse 73 01/29/21 1030  Resp 15 01/29/21 1030  SpO2 100 % 01/29/21 1030  Vitals shown include unvalidated device data.  Last Pain:  Vitals:   01/29/21 0623  TempSrc: Temporal  PainSc: 0-No pain         Complications: No complications documented.

## 2021-01-29 NOTE — Brief Op Note (Signed)
01/29/2021  10:16 AM  PATIENT:  Debra James  50 y.o. female  PRE-OPERATIVE DIAGNOSIS:  fibroid symptomatic Anemia , right ovarian cyst  POST-OPERATIVE DIAGNOSIS:  fibroid symptomatic Anemia , left ovarian cyst  PROCEDURE:  Procedure(s): HYSTERECTOMY ABDOMINAL WITH SALPINGECTOMY (Bilateral) OVARIAN CYSTECTOMY (Left)  SURGEON:  Surgeon(s) and Role:    * Kelicia Youtz, Gwen Her, MD - Primary    * Benjaman Kindler, MD - Assisting  PHYSICIAN ASSISTANT: PA  Shive   ASSISTANTS: none   ANESTHESIA:   general  EBL:  330 mL   BLOOD ADMINISTERED:none  DRAINS: Urinary Catheter (Foley)   LOCAL MEDICATIONS USED:  MARCAINE    and BUPIVICAINE   SPECIMEN:  Source of Specimen:  uterus , cervix , bilateral fallopian tube , left ovariancyst   DISPOSITION OF SPECIMEN:  PATHOLOGY  COUNTS:  YES  TOURNIQUET:  * No tourniquets in log *  DICTATION: .Other Dictation: Dictation Number verbal  PLAN OF CARE: Admit to inpatient   PATIENT DISPOSITION:  PACU - hemodynamically stable.   Delay start of Pharmacological VTE agent (>24hrs) due to surgical blood loss or risk of bleeding: not applicable

## 2021-01-29 NOTE — Progress Notes (Signed)
Day of Surgery Procedure(s) (LRB): HYSTERECTOMY ABDOMINAL WITH SALPINGECTOMY (Bilateral) OVARIAN CYSTECTOMY (Left) Pain in adequate control with PCA Subjective: Patient reports :.    Objective: I have reviewed patient's vital signs and intake and output.    Assessment: s/p Procedure(s): HYSTERECTOMY ABDOMINAL WITH SALPINGECTOMY (Bilateral) OVARIAN CYSTECTOMY (Left):  stable  Plan: cont IVF , PCA . D/C foley in am   IS tonight   LOS: 0 days    Gwen Her Schermerhorn 01/29/2021, 5:45 PM

## 2021-01-29 NOTE — Anesthesia Preprocedure Evaluation (Signed)
Anesthesia Evaluation  Patient identified by MRN, date of birth, ID band Patient awake    Reviewed: Allergy & Precautions, NPO status , Patient's Chart, lab work & pertinent test results  History of Anesthesia Complications Negative for: history of anesthetic complications  Airway Mallampati: I  TM Distance: >3 FB Neck ROM: Full    Dental no notable dental hx. (+) Teeth Intact   Pulmonary neg pulmonary ROS, neg sleep apnea, neg COPD, Patient abstained from smoking.Not current smoker,    Pulmonary exam normal breath sounds clear to auscultation       Cardiovascular Exercise Tolerance: Good METS(-) hypertension(-) CAD and (-) Past MI negative cardio ROS  (-) dysrhythmias  Rhythm:Regular Rate:Normal - Systolic murmurs    Neuro/Psych negative neurological ROS  negative psych ROS   GI/Hepatic neg GERD  ,(+)     (-) substance abuse  ,   Endo/Other  neg diabetes  Renal/GU negative Renal ROS     Musculoskeletal   Abdominal   Peds  Hematology  (+) Blood dyscrasia, anemia ,   Anesthesia Other Findings Past Medical History: No date: Anemia  Reproductive/Obstetrics                             Anesthesia Physical Anesthesia Plan  ASA: I  Anesthesia Plan: General   Post-op Pain Management:    Induction: Intravenous  PONV Risk Score and Plan: 4 or greater and Ondansetron, Dexamethasone, Propofol infusion, TIVA and Midazolam  Airway Management Planned: Oral ETT  Additional Equipment: None  Intra-op Plan:   Post-operative Plan: Extubation in OR  Informed Consent: I have reviewed the patients History and Physical, chart, labs and discussed the procedure including the risks, benefits and alternatives for the proposed anesthesia with the patient or authorized representative who has indicated his/her understanding and acceptance.     Dental advisory given  Plan Discussed with: CRNA and  Surgeon  Anesthesia Plan Comments: (Discussed risks of anesthesia with patient, including PONV, sore throat, lip/dental damage. Rare risks discussed as well, such as cardiorespiratory and neurological sequelae. Patient understands. Patient has listed allergy to PCN. - rash years ago Severe blistering skin reaction (SJS/TEN)?  No  Liver or kidney injury caused by PCN? no Hemolytic anemia from PCN? no Drug fever? no Painful swollen joints? no Severe reaction involving inside of mouth, eye, or genital ulcers? No  Based on current evidence Petra Kuba al, J Allergy Clin Immunol Pract, 2019), will proceed with cefazolin use: Yes  )        Anesthesia Quick Evaluation

## 2021-01-29 NOTE — Op Note (Signed)
NAME: Debra James, DEVOS MEDICAL RECORD NO: [id number] ACCOUNT NO: [id number] DATE OF BIRTH: [birth year] FACILITY: [facility] LOCATION: [facility] PHYSICIAN: Boykin Nearing, MD  Operative Report   DATE OF PROCEDURE: 01/29/2021  PREOPERATIVE DIAGNOSES:   1.  Symptomatic fibroid uterus with menorrhagia. 2.  Right  ovarian cyst. 3.  Chronic anemia.  POSTOPERATIVE DIAGNOSES:   1.  Symptomatic fibroid uterus with menorrhagia. 2.  Left ovarian cyst. 3.  Chronic anemia.  PROCEDURE:   1.  Total abdominal hysterectomy. 2.  Bilateral salpingectomy. 3.  Left ovarian cystectomy.  SURGEON:  Boykin Nearing, MD  ASSISTANT:  Leafy Ro, MD  SECOND ASSISTANT:  PA student, Shive.  ANESTHESIA:  General endotracheal anesthesia.  INDICATIONS:  A 50 year old gravida 5, para 4.  The patient with a long history of menorrhagia and chronic anemia.  The patient has a 20-week size uterus on examination and ultrasound.  Blood work is consistent with chronic blood loss with anemia.  DESCRIPTION OF PROCEDURE:  After adequate general endotracheal anesthesia, the patient was placed in dorsal supine position with legs in the [place] stirrups.  The patient's abdomen, perineum and vagina were prepped and draped in normal sterile fashion.   Foley catheter was placed.  A timeout was performed.  The patient did receive 2 grams of IV Ancef prior to commencement of the case for surgical prophylaxis.  An exam under anesthesia revealed an approximately 20-week size uterus, broad based and deep  into the pelvis.  Surgeon decided to perform a midline vertical incision based on the size of the uterus.  An incision was made from two fingerbreadths above the symphysis pubis to the 2 cm inferior to the umbilicus.  Sharp dissection was used to identify the fascia.   Fascia was opened in a vertical fashion.  The peritoneum was entered sharply.  Large bulbous uterus filled the pelvis and with some effort the uterus was delivered  through the incision.  The round ligaments on both sides were suture ligated and  transected.  The left ovary appeared to have the 5 x 4 cm ovarian cyst that was documented previously on ultrasound.  The left ovarian cyst wall was opened and the cyst lining was removed with traction and countertraction and will be sent to pathology  for separate identification.  Good hemostasis was noted.  The uteroovarian ligament was bilaterally clamped, transected and the ovary was then allowed to fall laterally.  A window was made in the left broad ligament rather and two Heaney clamps were used  to incorporate the left fallopian tube.  The uterine artery was then skeletonized on the left and the uterine artery and vessels were doubly clamped with Heaney ballantine clamps and the uterine artery was transected and suture ligated with 0 Vicryl  suture.  Similar procedure was repeated on the patient's right side with doubly clamping the uteroovarian ligament, transecting and suture ligating.  Broad ligament was entered and the uterine artery on the right was skeletonized and the uterine artery  was then bilaterally clamped, transected and suture ligated with 0 Vicryl suture.  Given the girth and the size of the uterus, the uterus was amputated above the previously clamped uterine arteries and uterus was delivered off the operative field.   Bladder flap was created with sharp and blunt dissection and the cardinal ligaments were bilaterally clamped, transected, suture ligated and curved Heaney clamps were used to clamp the vaginal cuff angles with delivery of the cervix.  Vaginal cuff was  then closed with interrupted  0 Vicryl suture.  At this point, the right fallopian tube, which was left behind during the dissection was clamped at the mesosalpinx and removed and suture ligated.  The ureters were identified bilaterally with good  peristaltic activity.  The patient's abdomen was copiously irrigated with good hemostasis  noted.  All pedicles appeared hemostatic.  The sutures were then cut.  Sponge and instrument counts were correct and the fascia was then closed with #1 PDS suture  in a modified Smead-Jones fashion.  Good approximation of the fascia.  Fascial edges were then injected with a solution of 20 mL of 1.3% Exparel plus 30 mL of 0.5% Marcaine in 50 mL normal saline, approximately 50 mL of the solution was injected into the  fascial edges.  Subcutaneous tissues were hemostatic and the skin was reapproximated with Insorb absorbable staples with good cosmetic effect.  Additional 30 mL of Exparel solution was injected beneath the skin.  A sterile dressing was applied.  There  were no complications.  ESTIMATED BLOOD LOSS:  340 mL  INTRAOPERATIVE FLUIDS:  1400 mL.  URINE OUTPUT:  200 mL.  The patient did receive 30 mg intravenous Toradol at the end of the case.  There were no complications.  The patient was taken to recovery room in good condition.   PUS D: 01/29/2021 11:21:45 am T: 01/29/2021 12:18:00 pm  JOB: [id number]/ [id number]

## 2021-01-29 NOTE — Anesthesia Procedure Notes (Signed)
Procedure Name: Intubation Date/Time: 01/29/2021 7:43 AM Performed by: Tollie Eth, CRNA Pre-anesthesia Checklist: Patient identified, Patient being monitored, Timeout performed, Emergency Drugs available and Suction available Patient Re-evaluated:Patient Re-evaluated prior to induction Oxygen Delivery Method: Circle system utilized Preoxygenation: Pre-oxygenation with 100% oxygen Induction Type: IV induction Ventilation: Mask ventilation without difficulty Laryngoscope Size: 3 and McGraph Grade View: Grade I Tube type: Oral Tube size: 7.0 mm Number of attempts: 1 Airway Equipment and Method: Stylet Placement Confirmation: ETT inserted through vocal cords under direct vision,  positive ETCO2 and breath sounds checked- equal and bilateral Secured at: 21 cm Tube secured with: Tape Dental Injury: Teeth and Oropharynx as per pre-operative assessment

## 2021-01-30 LAB — BASIC METABOLIC PANEL
Anion gap: 6 (ref 5–15)
BUN: 7 mg/dL (ref 6–20)
CO2: 24 mmol/L (ref 22–32)
Calcium: 8.5 mg/dL — ABNORMAL LOW (ref 8.9–10.3)
Chloride: 107 mmol/L (ref 98–111)
Creatinine, Ser: 0.76 mg/dL (ref 0.44–1.00)
GFR, Estimated: >60 mL/min (ref >60)
Glucose, Bld: 115 mg/dL — ABNORMAL HIGH (ref 70–99)
Potassium: 3.7 mmol/L (ref 3.5–5.1)
Sodium: 137 mmol/L (ref 135–145)

## 2021-01-30 LAB — CBC
HCT: 32.8 % — ABNORMAL LOW (ref 36.0–46.0)
Hemoglobin: 10 g/dL — ABNORMAL LOW (ref 12.0–15.0)
MCH: 22.2 pg — ABNORMAL LOW (ref 26.0–34.0)
MCHC: 30.5 g/dL (ref 30.0–36.0)
MCV: 72.9 fL — ABNORMAL LOW (ref 80.0–100.0)
Platelets: 370 10*3/uL (ref 150–400)
RBC: 4.5 MIL/uL (ref 3.87–5.11)
RDW: 26.9 % — ABNORMAL HIGH (ref 11.5–15.5)
WBC: 15.7 10*3/uL — ABNORMAL HIGH (ref 4.0–10.5)
nRBC: 0 % (ref 0.0–0.2)

## 2021-01-30 LAB — SURGICAL PATHOLOGY

## 2021-01-30 MED ORDER — IBUPROFEN 600 MG PO TABS
600.0000 mg | ORAL_TABLET | Freq: Four times a day (QID) | ORAL | Status: DC | PRN
  Administered 2021-01-30 – 2021-01-31 (×4): 600 mg via ORAL
  Filled 2021-01-30 (×4): qty 1

## 2021-01-30 MED ORDER — ACETAMINOPHEN 500 MG PO TABS
1000.0000 mg | ORAL_TABLET | Freq: Four times a day (QID) | ORAL | Status: DC
  Administered 2021-01-31 (×2): 1000 mg via ORAL
  Filled 2021-01-30 (×2): qty 2

## 2021-01-30 MED ORDER — ONDANSETRON HCL 4 MG PO TABS: 8.0000 mg | ORAL_TABLET | Freq: Three times a day (TID) | ORAL | Status: DC | PRN

## 2021-01-30 MED ORDER — GABAPENTIN 300 MG PO CAPS
300.0000 mg | ORAL_CAPSULE | Freq: Every day | ORAL | Status: DC
  Administered 2021-01-30: 300 mg via ORAL

## 2021-01-30 MED ORDER — OXYCODONE-ACETAMINOPHEN 5-325 MG PO TABS
1.0000 | ORAL_TABLET | Freq: Four times a day (QID) | ORAL | Status: DC | PRN
  Administered 2021-01-30: 2 via ORAL
  Administered 2021-01-31: 1 via ORAL
  Filled 2021-01-30: qty 1
  Filled 2021-01-30: qty 2
  Filled 2021-01-30: qty 1

## 2021-01-30 NOTE — Progress Notes (Signed)
1 Day Post-Op Procedure(s) (LRB): HYSTERECTOMY ABDOMINAL WITH SALPINGECTOMY (Bilateral) OVARIAN CYSTECTOMY (Left) Pain in good control  Subjective: Patient reports no n/v , hungry   passing gas.    Objective: I have reviewed patient's vital signs, intake and output, medications and labs.  General: alert and cooperative Resp: clear to auscultation bilaterally Cardio: regular rate and rhythm, S1, S2 normal, no murmur, click, rub or gallop GI: soft, non-tender; bowel sounds normal; no masses,  no organomegaly  Assessment: s/p Procedure(s): HYSTERECTOMY ABDOMINAL WITH SALPINGECTOMY (Bilateral) OVARIAN CYSTECTOMY (Left): stable  Plan: Advance diet Encourage ambulation Advance to PO medication Discontinue IV fluids d/c PCA  LOS: 1 day    Gwen Her Saleen Peden 01/30/2021, 11:22 AM

## 2021-01-30 NOTE — Progress Notes (Signed)
Patient ID: Debra James, female   DOB: [birth year], 50 y.o.   MRN: [id number] Continued improvement throughout the day  Pt will stay overnight  Anticipate d/c tomorrow

## 2021-01-31 MED ORDER — IBUPROFEN 800 MG PO TABS: 800.0000 mg | ORAL_TABLET | Freq: Three times a day (TID) | ORAL | 0 refills | Status: AC | PRN

## 2021-01-31 MED ORDER — OXYCODONE-ACETAMINOPHEN 5-325 MG PO TABS: 1.0000 | ORAL_TABLET | ORAL | 0 refills | Status: AC | PRN

## 2021-01-31 MED ORDER — ONDANSETRON HCL 4 MG PO TABS: 4.0000 mg | ORAL_TABLET | Freq: Three times a day (TID) | ORAL | 1 refills | Status: AC | PRN

## 2021-01-31 MED ORDER — GABAPENTIN 300 MG PO CAPS: 300.0000 mg | ORAL_CAPSULE | Freq: Every day | ORAL | 0 refills | Status: AC

## 2021-01-31 NOTE — Progress Notes (Signed)
Pt discharged via wheelchair, discharge instructions for patient discussed and patient verbalized understanding. Patients vital signs all WDL.

## 2021-01-31 NOTE — Discharge Summary (Signed)
Physician Discharge Summary  Patient ID: Debra James MRN: [id number] DOB/AGE: 02/07/1971 50 y.o.  Admit date: 01/29/2021 Discharge date: 01/31/2021  Admission Diagnoses:symptomatic fibroid Uterus , menorrhagia, anemia  Discharge Diagnoses: same  Active Problems:   Postoperative state   Discharged Condition: good  Hospital Course: underwent a TAH , bilateral salpingectomy and left ovarian cystectomy. PCA day of surgery and then po percocet . Diet advanced . VSS throughout .   Consults: None  Significant Diagnostic Studies: labs:  Results for orders placed or performed during the hospital encounter of 01/29/21 (from the past 72 hour(s))  Pregnancy, urine POC     Status: None   Collection Time: 01/29/21  6:19 AM  Result Value Ref Range   Preg Test, Ur NEGATIVE NEGATIVE    Comment:        THE SENSITIVITY OF THIS METHODOLOGY IS >24 mIU/mL   ABO/Rh     Status: None   Collection Time: 01/29/21  6:37 AM  Result Value Ref Range   ABO/RH(D)      A NEG Performed at [facility], [address] [place], [place] [address]   Pregnancy, urine POC     Status: None   Collection Time: 01/29/21  6:59 AM  Result Value Ref Range   Preg Test, Ur NEGATIVE NEGATIVE    Comment:        THE SENSITIVITY OF THIS METHODOLOGY IS >24 mIU/mL   Pregnancy, urine POC     Status: None   Collection Time: 01/29/21  7:15 AM  Result Value Ref Range   Urine-Other negative   Surgical pathology     Status: None   Collection Time: 01/29/21  8:29 AM  Result Value Ref Range   SURGICAL PATHOLOGY      SURGICAL PATHOLOGY CASE: [id number] PATIENT: [facility] Surgical Pathology Report     Specimen Submitted: A. Cyst wall, left B. Uterus, cervix, bil fall tubes  Clinical History: Fibroid symptomatic      DIAGNOSIS: A. OVARIAN CYST WALL, LEFT; OVARIAN CYSTECTOMY: - FOLLICULAR CYST. - NEGATIVE FOR MALIGNANCY.  B. UTERUS AND CERVIX WITH BILATERAL FALLOPIAN TUBES; TOTAL  HYSTERECTOMY WITH BILATERAL SALPINGECTOMY: - CERVIX:      - NEGATIVE FOR DYSPLASIA AND MALIGNANCY. - ENDOMETRIUM:      - PROLIFERATIVE.  NEGATIVE FOR ATYPIA / EIN AND MALIGNANCY. 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      - LEIOMYOMATA, MULTIPLE.      - NEGATIVE FOR MALIGNANCY. - BILATERAL FALLOPIAN TUBES:      - NO SIGNIFICANT PATHOLOGIC ALTERATION.   GROSS DESCRIPTION: A. Labeled: Cyst wall, left Received: Formalin Collection time: 8:29 AM on 01/29/2021 Placed into formalin time: 8:37 AM on 01/29/2021 Tissue fragment(s): 1 Size: 4.3 x 1.3 x 0.5 cm Des cription: Received is a highly disrupted fragment of tan-pink smooth membranous tissue.  No distinct masses or lesions are grossly appreciated. Representative sections (1/cm) are submitted in 1 cassette.  B. Labeled: Uterus, bilateral tubes, cervix Received: Formalin Collection time: 9:10 AM on 01/29/2021 Placed into formalin time: 9:10 AM on 01/29/2021 Weight: 1484 grams Dimensions:      Fundus -17.2 (superior to inferior) x 14 (anterior to posterior) x 13.5 (breadth of uterus at fundus) cm      Cervix -4.1 x 3.6 cm Serosa: The serosa is tan-pink, smooth, and glistening. Cervix: The cervix and lower uterine segment is received detached from the uterine body.  The cervix is tan-white, smooth, and pearly.  Endocervix: The endocervix is tan, mucoid, and finely granular with a 1.5 cm slit-like endocervical os.  Within the wall there are multiple unilocular cysts which contain gelatinous contents, ranging from 0.1 to 0.5 cm in greatest dimension. Endometrial cavity:       Dimensions -6.5 (superior to inferior) x 2.7 (cornu to cornu) cm      Thickness -Ranges from 0.1 to 0.3 cm      Other findings -the endometrial cavity is distorted due to the presence of a large intramural nodule.  The endometrium is tan-red and finely granular. Myometrium:     Thickness -4.1 cm     Other findings -the myometrium is  tan-pink and diffusely trabeculated with 4 intramural nodules ranging from 0.4-13.1 cm in greatest dimension.  The 3 smallest nodules have tan-white, whorled, firm, and well-circumscribed cut surfaces.  The largest nodule has a tan-white, softened, and focally cystic cut surface. Adnexa:      Right fallopian tube           Measurements -3.5 cm in length x 0.7 cm in diameter; 3.2 x 2 x 0.5 cm           Other findings -received attached to the uterine body is a portion of fallopian tube.  This portion of fallopian tube has a tan-pink, smooth, and glistening serosal surface.  The lumen is pinpoint and patent.  Received detached in the [place] is a portion of fimbria which is presumed to correlate with the right fallopian tube.      Left fallopian tube            Measurements -8.5 cm in length x 0.7 cm in diameter           Other findings -the fallopian tube is received attached to the uterine body.  The serosa is purple-pink, smooth, and glistening.  There are multiple paratubal cysts ranging from 0.7 to 1.1 cm in greatest dimension.  The lumen is pinpoint and patent. Other comments: None grossly appreciated.  Block summary: 1 - representative cervix/endocervix, side A 2 - representative cervix/endocervix, side B 3 - 4 - representative presumed anterior transmural endomyometrium 5 - 6 - representative presumed posterior transmural endomyometrium 7 - representative trabeculated myometrium 8 - representative smaller intramural nodules 9 - 15 - representative sections of larger discolored intramural nodule (1/cm) 16 - 17 - presumed right fallopian tube fimbria, longitudinally sectioned and submitted en tirely 18 - representative presumed right fallopian tube cross-sections 19 - 20 - presumed left fallopian tube fimbria, longitudinally sectioned and submitted entirely 21 - representative presumed left fallopian tube cross-sections  RB 01/29/2021   Final Diagnosis performed by  Betsy Pries, MD.   Electronically signed 01/30/2021 2:08:57PM The electronic signature indicates that the named Attending Pathologist has evaluated the specimen Technical component performed at [facility], [address], [place], [place] [address] Lab: [phone] Dir: Rush Farmer, MD, MMM  Professional component performed at [facility], [facility], [address], [place], [place] [address] Lab: [phone] Dir: Dellia Nims. Rubinas, MD   CBC     Status: Abnormal   Collection Time: 01/30/21  4:26 AM  Result Value Ref Range   WBC 15.7 (H) 4.0 - 10.5 K/uL   RBC 4.50 3.87 - 5.11 MIL/uL   Hemoglobin 10.0 (L) 12.0 - 15.0 g/dL   HCT 32.8 (L) 36.0 - 46.0 %   MCV 72.9 (L) 80.0 - 100.0 fL   MCH 22.2 (L) 26.0 - 34.0 pg   MCHC 30.5 30.0 -  36.0 g/dL   RDW 26.9 (H) 11.5 - 15.5 %   Platelets 370 150 - 400 K/uL   nRBC 0.0 0.0 - 0.2 %    Comment: Performed at [facility], [address] [place], [place] [address]  Basic metabolic panel     Status: Abnormal   Collection Time: 01/30/21  4:26 AM  Result Value Ref Range   Sodium 137 135 - 145 mmol/L   Potassium 3.7 3.5 - 5.1 mmol/L   Chloride 107 98 - 111 mmol/L   CO2 24 22 - 32 mmol/L   Glucose, Bld 115 (H) 70 - 99 mg/dL    Comment: Glucose reference range applies only to samples taken after fasting for at least 8 hours.   BUN 7 6 - 20 mg/dL   Creatinine, Ser 0.76 0.44 - 1.00 mg/dL   Calcium 8.5 (L) 8.9 - 10.3 mg/dL   GFR, Estimated >60 >60 mL/min    Comment: (NOTE) Calculated using the CKD-EPI Creatinine Equation (2021)    Anion gap 6 5 - 15    Comment: Performed at [facility], [address] [place], [place] [address]    Treatments: surgery: as above  Discharge Exam: Blood pressure 103/69, pulse 67, temperature 98 F (36.7 C), temperature source Oral, resp. rate 18, height 5\' 7"  (1.702 m), weight 81.2 kg, last menstrual period 01/07/2021, SpO2 100 %. General appearance: alert and  cooperative Resp: clear to auscultation bilaterally Cardio: regular rate and rhythm, S1, S2 normal, no murmur, click, rub or gallop GI: soft, non-tender; bowel sounds normal; no masses,  no organomegaly  Disposition: Discharge disposition: 01-Home or Self Care       Discharge Instructions    Call MD for:   Complete by: As directed    Call for heavy vaginal bleeding   Call MD for:  difficulty breathing, headache or visual disturbances   Complete by: As directed    Call MD for:  extreme fatigue   Complete by: As directed    Call MD for:  hives   Complete by: As directed    Call MD for:  persistant dizziness or light-headedness   Complete by: As directed    Call MD for:  persistant nausea and vomiting   Complete by: As directed    Call MD for:  redness, tenderness, or signs of infection (pain, swelling, redness, odor or green/yellow discharge around incision site)   Complete by: As directed    Call MD for:  severe uncontrolled pain   Complete by: As directed    Call MD for:  temperature >100.4   Complete by: As directed    Diet - low sodium heart healthy   Complete by: As directed    Increase activity slowly   Complete by: As directed    Leave dressing on - Keep it clean, dry, and intact until clinic visit   Complete by: As directed    Keep honeycomb on for 5 days , loose dressing after     Allergies as of 01/31/2021      Reactions   Penicillins Rash      Medication List    STOP taking these medications   tranexamic acid 650 MG Tabs tablet Commonly known as: LYSTEDA     TAKE these medications   acetaminophen 500 MG tablet Commonly known as: TYLENOL Take 1,000 mg by mouth every 6 (six) hours as needed for moderate pain or headache.   fluticasone 50 MCG/ACT nasal spray Commonly known as:  FLONASE Place 1 spray into both nostrils daily as needed for allergies or rhinitis.   gabapentin 300 MG capsule Commonly known as: Neurontin Take 1 capsule (300 mg total) by  mouth at bedtime for 14 days.   ibuprofen 800 MG tablet Commonly known as: ADVIL Take 1 tablet (800 mg total) by mouth every 8 (eight) hours as needed. What changed:   medication strength  how much to take  when to take this  reasons to take this   ondansetron 4 MG tablet Commonly known as: Zofran Take 1 tablet (4 mg total) by mouth every 8 (eight) hours as needed for up to 7 days for nausea or vomiting.   oxyCODONE-acetaminophen 5-325 MG tablet Commonly known as: Percocet Take 1 tablet by mouth every 4 (four) hours as needed for moderate pain or severe pain.            Discharge Care Instructions  (From admission, onward)         Start     Ordered   01/31/21 0000  Leave dressing on - Keep it clean, dry, and intact until clinic visit       Comments: Keep honeycomb on for 5 days , loose dressing after   01/31/21 1144          Follow-up Information    Kazuki Ingle, Gwen Her, MD Follow up in 2 week(s).   Specialty: Obstetrics and Gynecology Contact information: [address] [place] [address] [phone]               Signed: Gwen Her Bruce Mayers 01/31/2021, 11:50 AM

## 2021-02-09 NOTE — Addendum Note (Signed)
Encounter addended by: Kathyrn Drown, RN on: 02/09/2021 2:23 PM  Actions taken: Charge Capture section accepted

## 2021-03-03 ENCOUNTER — Other Ambulatory Visit: Payer: Self-pay | Admitting: Obstetrics and Gynecology

## 2021-03-03 DIAGNOSIS — Z1231 Encounter for screening mammogram for malignant neoplasm of breast: Secondary | ICD-10-CM

## 2021-03-04 ENCOUNTER — Other Ambulatory Visit: Payer: Self-pay

## 2021-03-04 ENCOUNTER — Ambulatory Visit
Admission: RE | Admit: 2021-03-04 | Discharge: 2021-03-04 | Disposition: A | Discharge status: Home / Self Care | Payer: BC Managed Care – PPO | Source: Ambulatory Visit | Attending: Obstetrics and Gynecology | Admitting: Obstetrics and Gynecology

## 2021-03-04 DIAGNOSIS — Z1231 Encounter for screening mammogram for malignant neoplasm of breast: Secondary | ICD-10-CM | POA: Diagnosis not present

## 2021-03-09 ENCOUNTER — Other Ambulatory Visit: Payer: Self-pay | Admitting: Obstetrics and Gynecology

## 2021-03-09 DIAGNOSIS — N6489 Other specified disorders of breast: Secondary | ICD-10-CM

## 2021-03-09 DIAGNOSIS — R928 Other abnormal and inconclusive findings on diagnostic imaging of breast: Secondary | ICD-10-CM

## 2021-03-16 ENCOUNTER — Ambulatory Visit
Admission: RE | Admit: 2021-03-16 | Discharge: 2021-03-16 | Disposition: A | Discharge status: Home / Self Care | Payer: BC Managed Care – PPO | Source: Ambulatory Visit | Attending: Obstetrics and Gynecology | Admitting: Obstetrics and Gynecology

## 2021-03-16 ENCOUNTER — Other Ambulatory Visit: Payer: Self-pay

## 2021-03-16 DIAGNOSIS — R928 Other abnormal and inconclusive findings on diagnostic imaging of breast: Secondary | ICD-10-CM | POA: Diagnosis present

## 2021-03-16 DIAGNOSIS — N6489 Other specified disorders of breast: Secondary | ICD-10-CM

## 2022-03-16 ENCOUNTER — Other Ambulatory Visit: Payer: Self-pay | Admitting: Obstetrics and Gynecology

## 2022-03-16 DIAGNOSIS — Z1231 Encounter for screening mammogram for malignant neoplasm of breast: Secondary | ICD-10-CM

## 2022-04-14 ENCOUNTER — Ambulatory Visit
Admission: RE | Admit: 2022-04-14 | Discharge: 2022-04-14 | Disposition: A | Discharge status: Home / Self Care | Payer: BC Managed Care – PPO | Source: Ambulatory Visit | Attending: Obstetrics and Gynecology | Admitting: Obstetrics and Gynecology

## 2022-04-14 DIAGNOSIS — Z1231 Encounter for screening mammogram for malignant neoplasm of breast: Secondary | ICD-10-CM | POA: Insufficient documentation

## 2022-11-22 IMAGING — US US PELVIS COMPLETE WITH TRANSVAGINAL
1 series · 13 of 25 positions shown · non-contrast
Comparison: None

CLINICAL DATA: Pelvic pain this week, pelvic pain in a female, LMP
12/04/2020, menses getting more painful and heavy year over the past
few months



[Series 1: us pelvic complete with transvaginal · 13 of 76 slices shown]
[im 1/76]
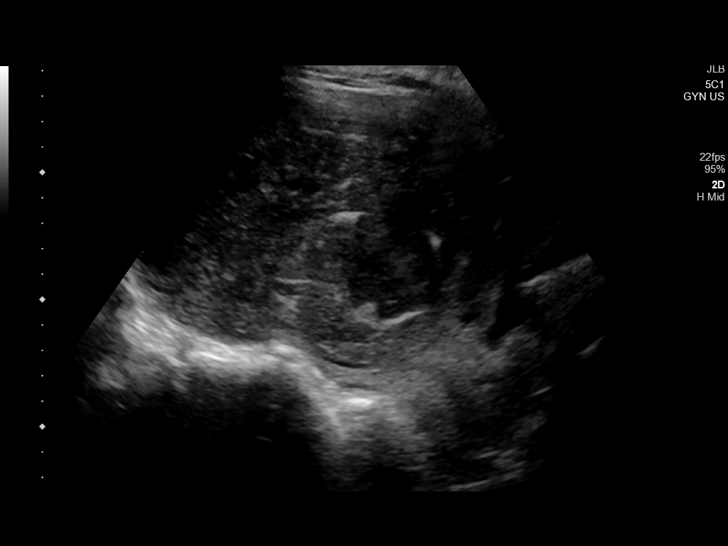
[im 7/76]
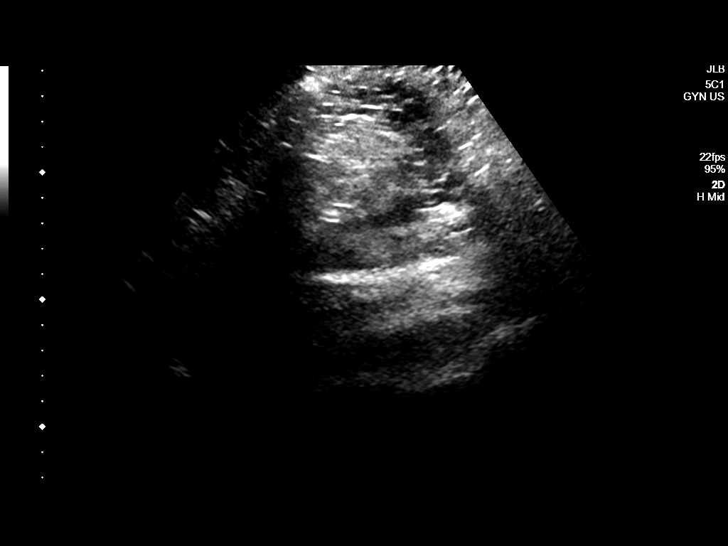
[im 13/76]
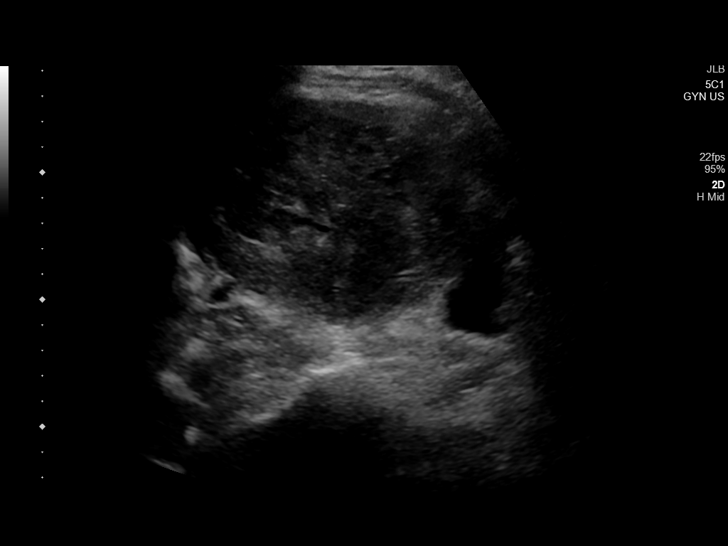
[im 19/76]
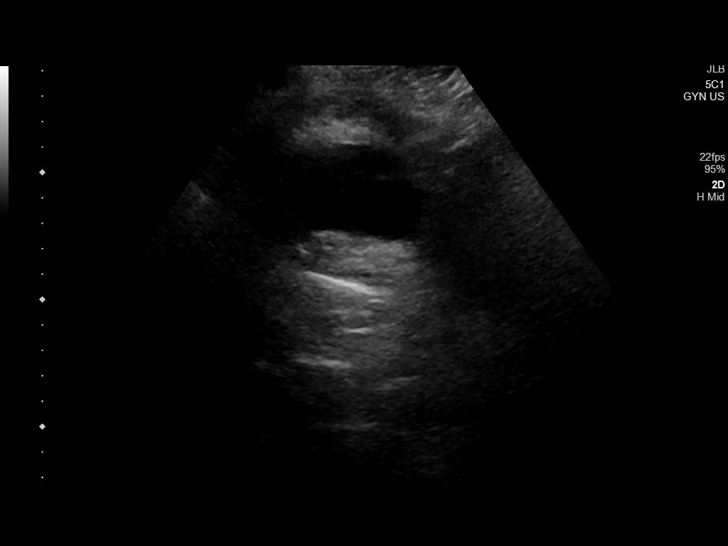
[im 26/76]
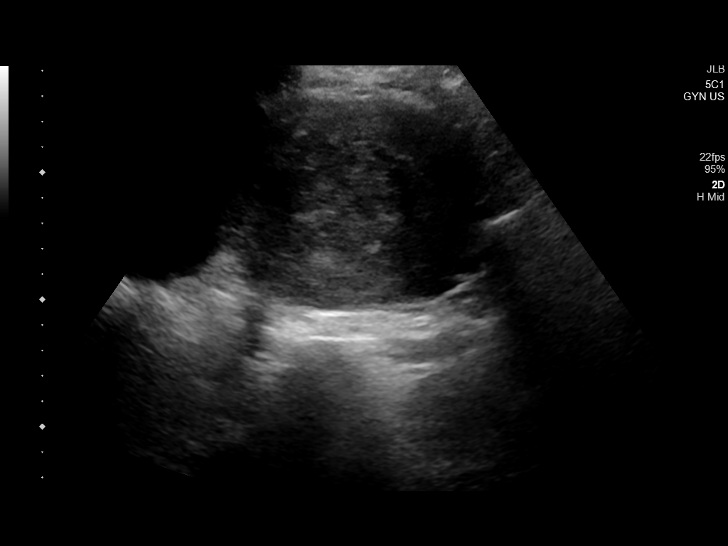
[im 32/76]
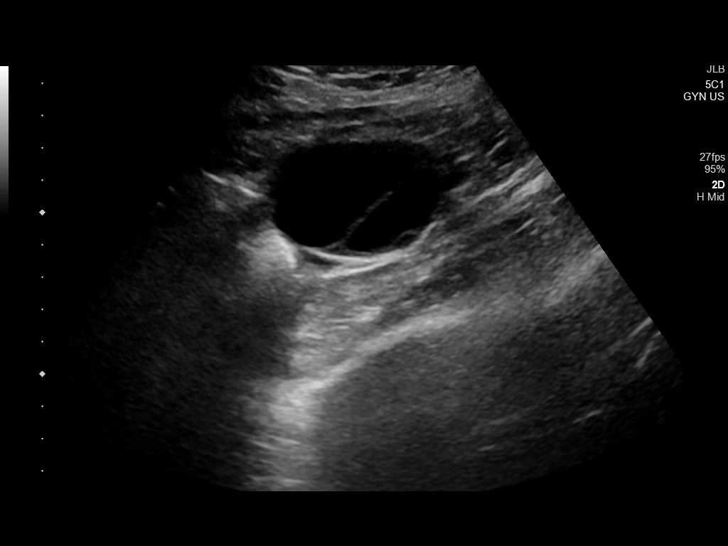
[im 38/76]
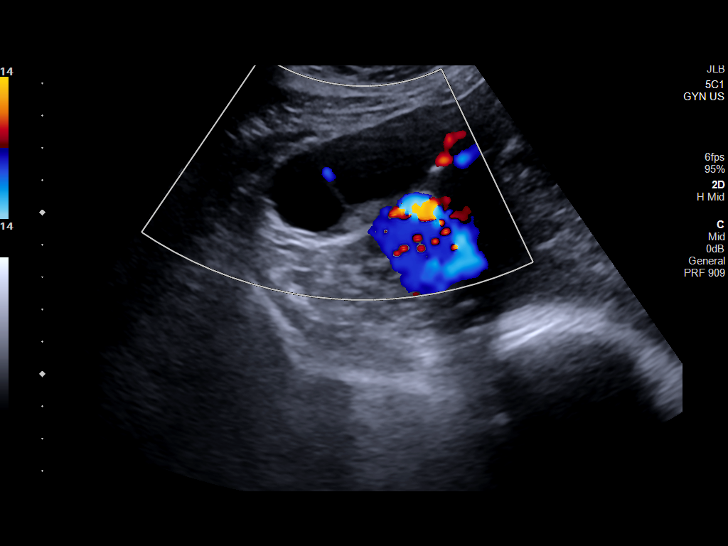
[im 44/76]
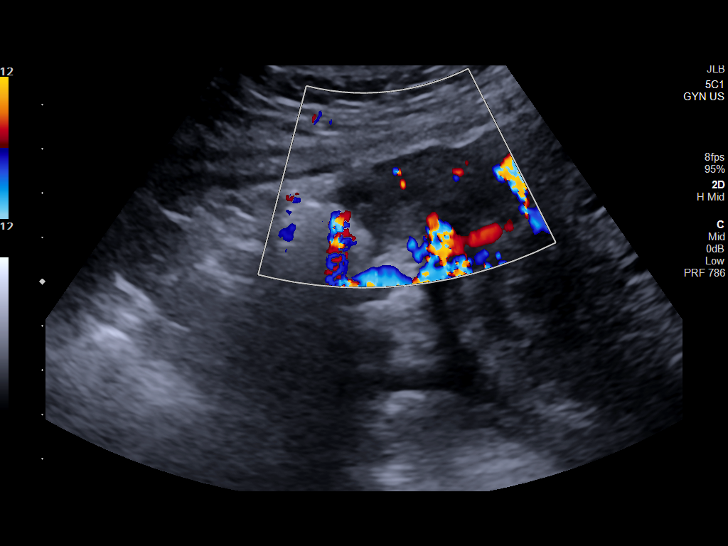
[im 51/76]
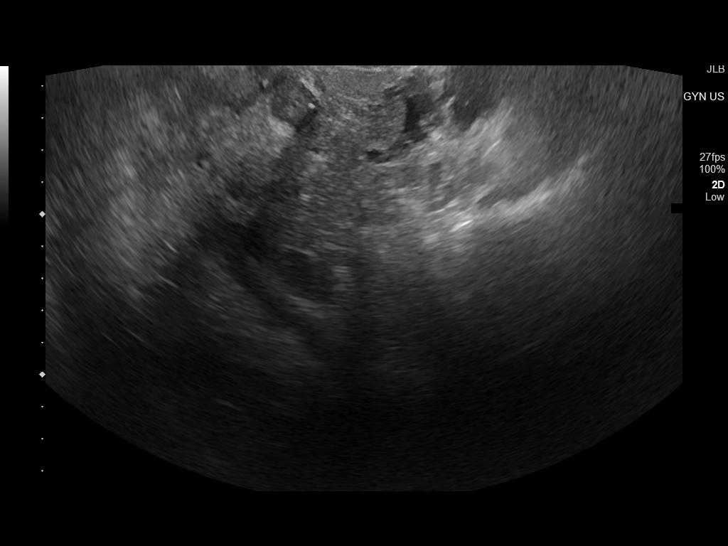
[im 57/76]
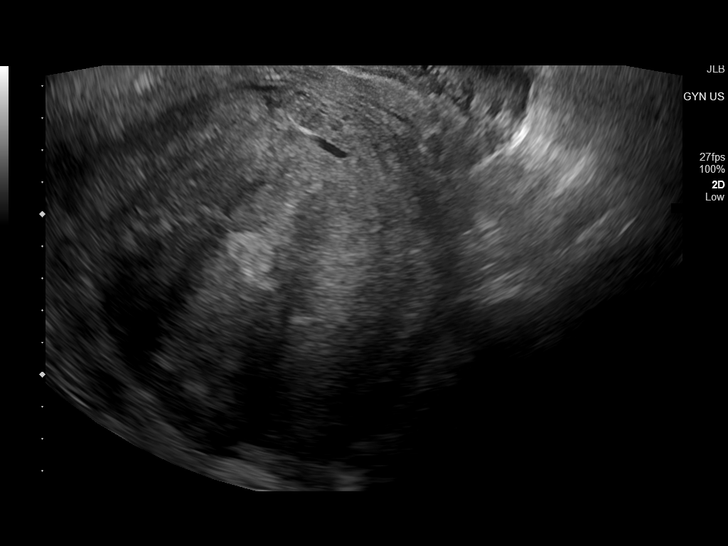
[im 63/76]
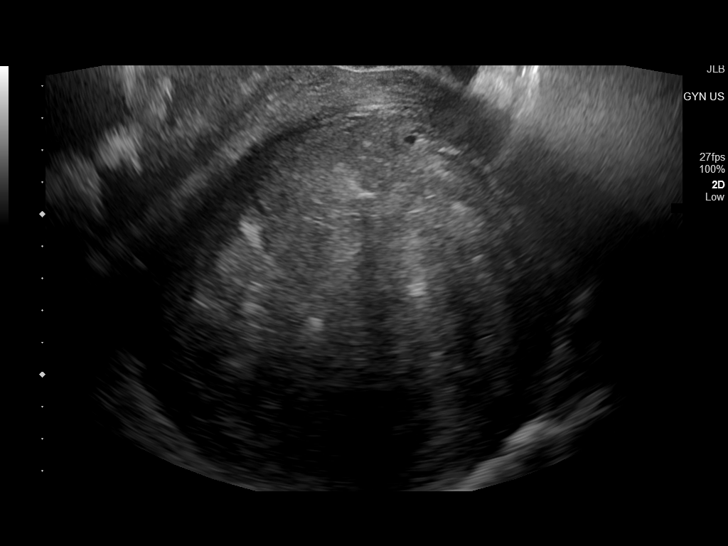
[im 69/76]
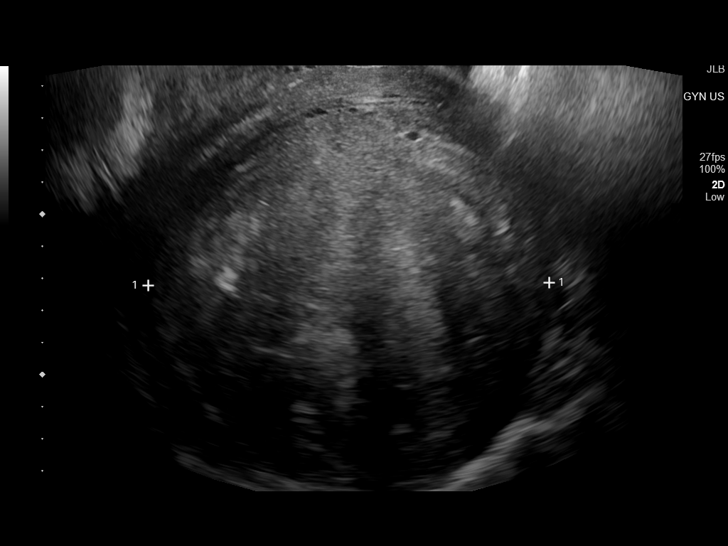
[im 76/76]
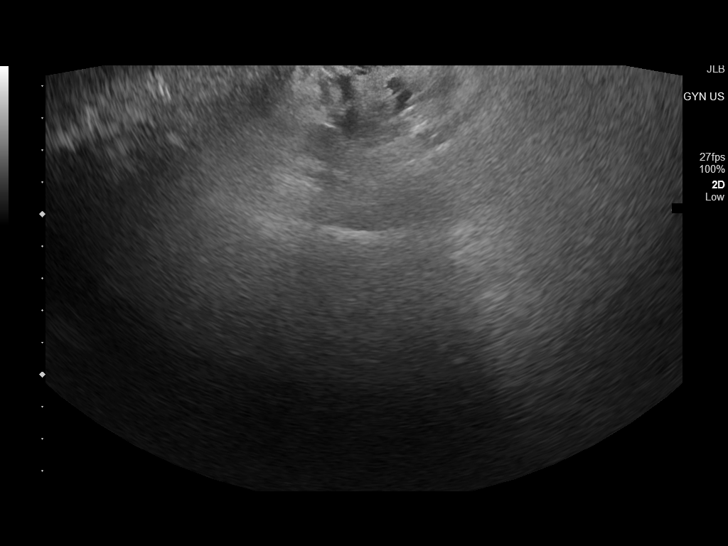

[13 of 25 positions shown; findings below may reference images not displayed]

FINDINGS: Uterus

Measurements: 17.8 x 14.2 x 14.0 cm = volume: 8488 mL. Anteverted.
Enlarged and heterogeneous. Large heterogeneous mass identified at
the mid upper uterine segments posteriorly 12.2 x 11.3 x 12.5 cm
consistent with a large transmural leiomyoma. Displacement and
compression of the remainder of the uterus including endometrial
complex anteriorly.

Endometrium

Nonvisualized due to compression and displacement by large uterine
mass

Right ovary

Measurements: 6.5 x 4.1 x 6.2 cm = volume: 86 mL. Cyst within RIGHT
ovary 5.5 x 3.5 x 5.8 cm containing a thin septation. No definite
mural nodularity.

Left ovary

Measurements: 3.2 x 1.4 x 2.6 cm = volume: 5.8 mL. Normal morphology
without mass.

Other findings

No free pelvic fluid.  No other pelvic masses.
IMPRESSION: Large transmural leiomyoma at the posterior upper uterus 12.2 cm in
greatest size, displacing and obscuring the endometrial complex
anteriorly.

Minimally complicated cyst of the RIGHT ovary 5.8 cm greatest size
containing a thin septation; this is an almost certainly benign cyst
and yearly ultrasound follow-up is recommended.

## 2022-12-17 DIAGNOSIS — Z1211 Encounter for screening for malignant neoplasm of colon: Secondary | ICD-10-CM

## 2022-12-17 DIAGNOSIS — K635 Polyp of colon: Secondary | ICD-10-CM

## 2023-03-09 ENCOUNTER — Other Ambulatory Visit: Payer: Self-pay | Admitting: Obstetrics and Gynecology

## 2023-03-09 DIAGNOSIS — Z1231 Encounter for screening mammogram for malignant neoplasm of breast: Secondary | ICD-10-CM

## 2023-06-14 ENCOUNTER — Ambulatory Visit
Admission: RE | Admit: 2023-06-14 | Discharge: 2023-06-14 | Disposition: A | Discharge status: Home / Self Care | Payer: BC Managed Care – PPO | Source: Ambulatory Visit | Attending: Obstetrics and Gynecology | Admitting: Obstetrics and Gynecology

## 2023-06-14 DIAGNOSIS — Z1231 Encounter for screening mammogram for malignant neoplasm of breast: Secondary | ICD-10-CM | POA: Diagnosis not present

## 2024-03-31 IMAGING — MG MM DIGITAL SCREENING BILAT W/ TOMO AND CAD
8 series · 8 of 24 positions shown · non-contrast
Comparison: Previous exam(s).

CLINICAL DATA: Screening.

EXAM:
DIGITAL SCREENING BILATERAL MAMMOGRAM WITH TOMOSYNTHESIS AND CAD
TECHNIQUE: Bilateral screening digital craniocaudal and mediolateral oblique
mammograms were obtained. Bilateral screening digital breast
tomosynthesis was performed. The images were evaluated with
computer-aided detection.

[L MLO synth-2D]
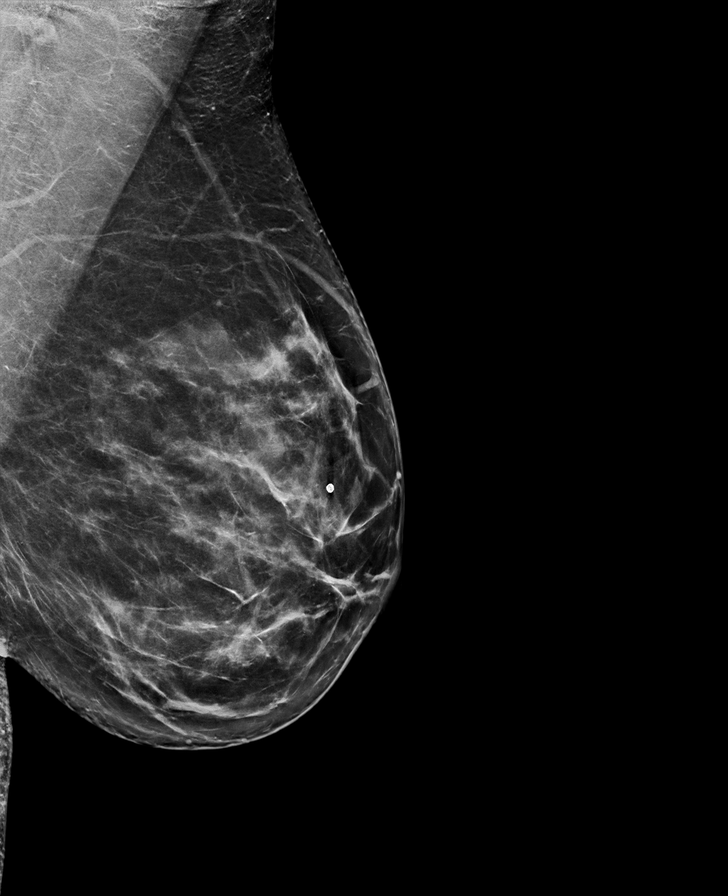

[L CC synth-2D]
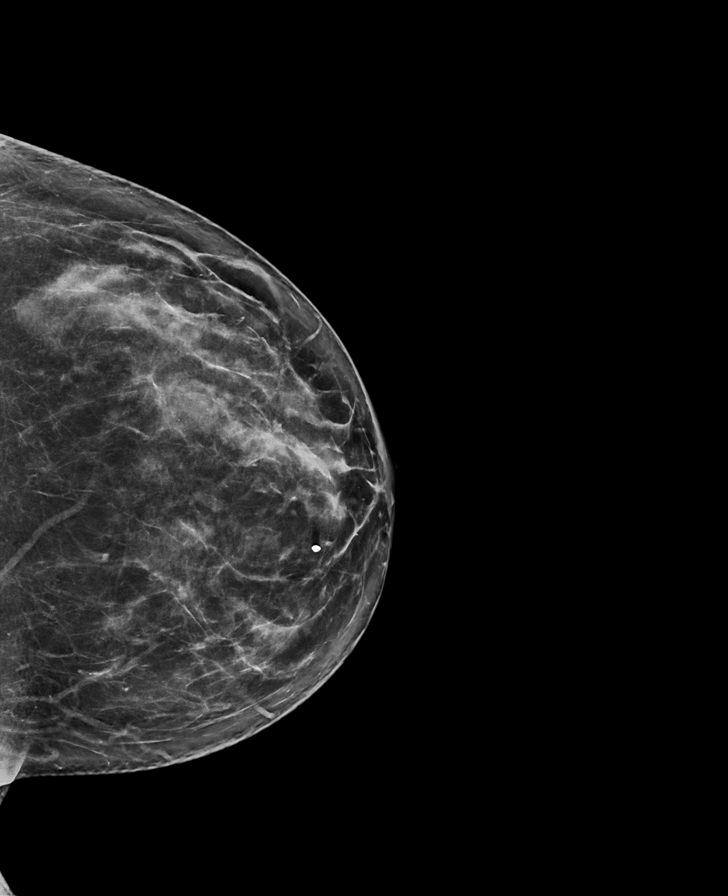

[R MLO synth-2D]
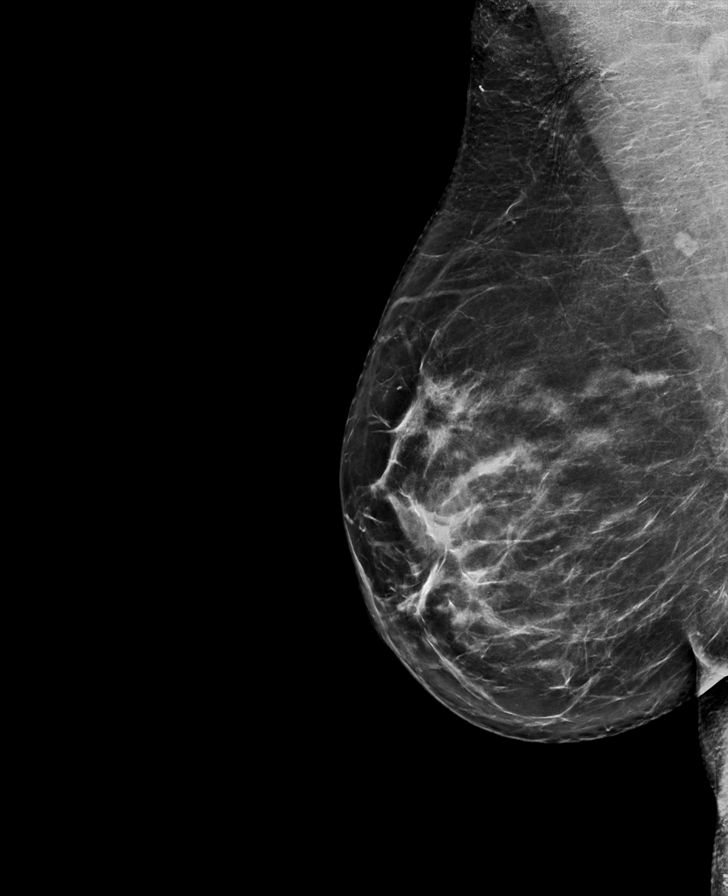

[R CC synth-2D]
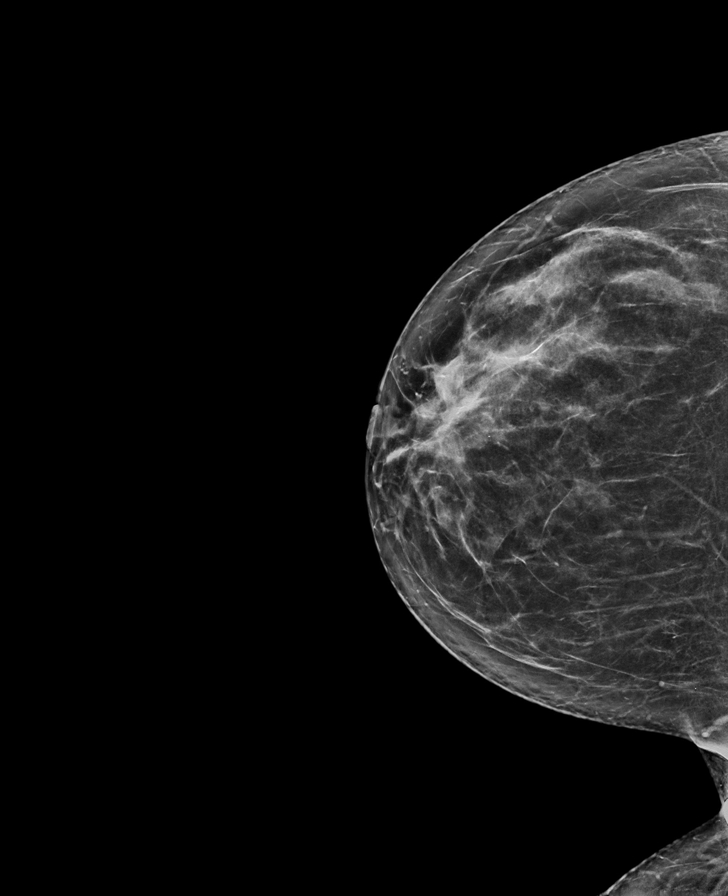

[R MLO tomo · tomo slice 45/89.0]
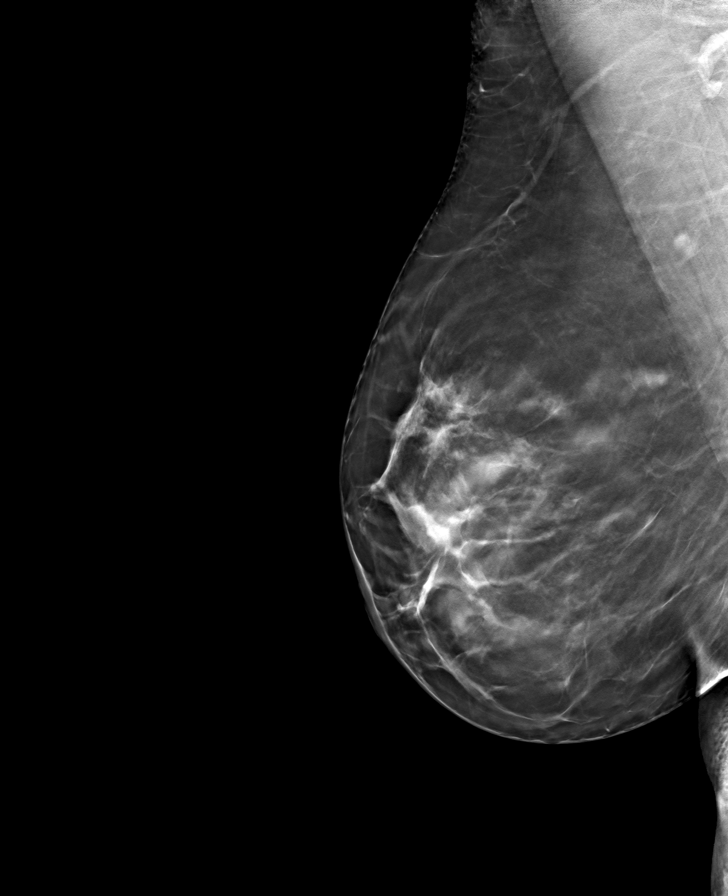

[R CC tomo · tomo slice 40/79.0]
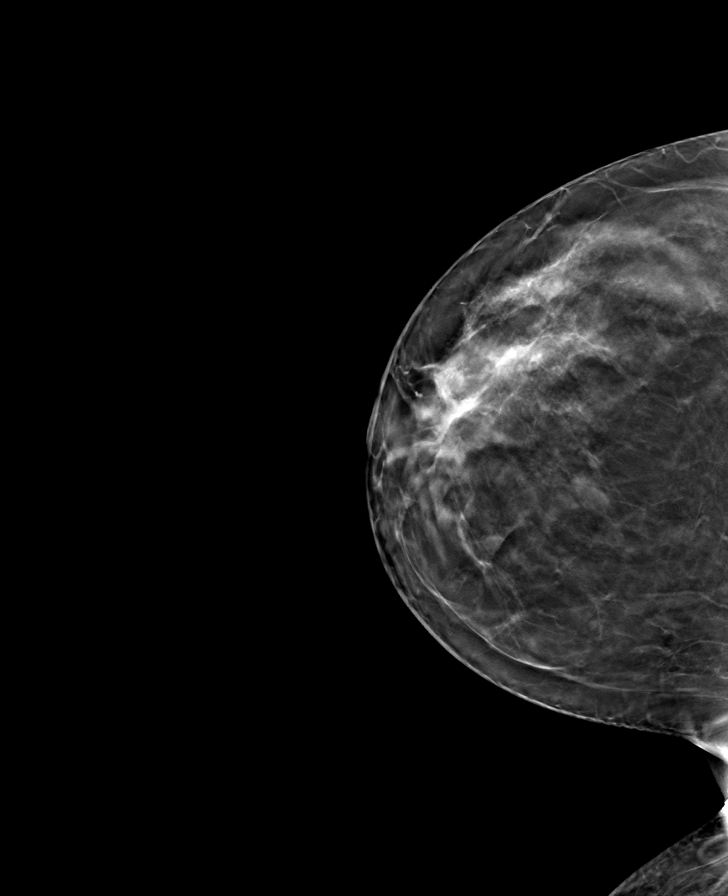

[L MLO tomo · tomo slice 43/85.0]
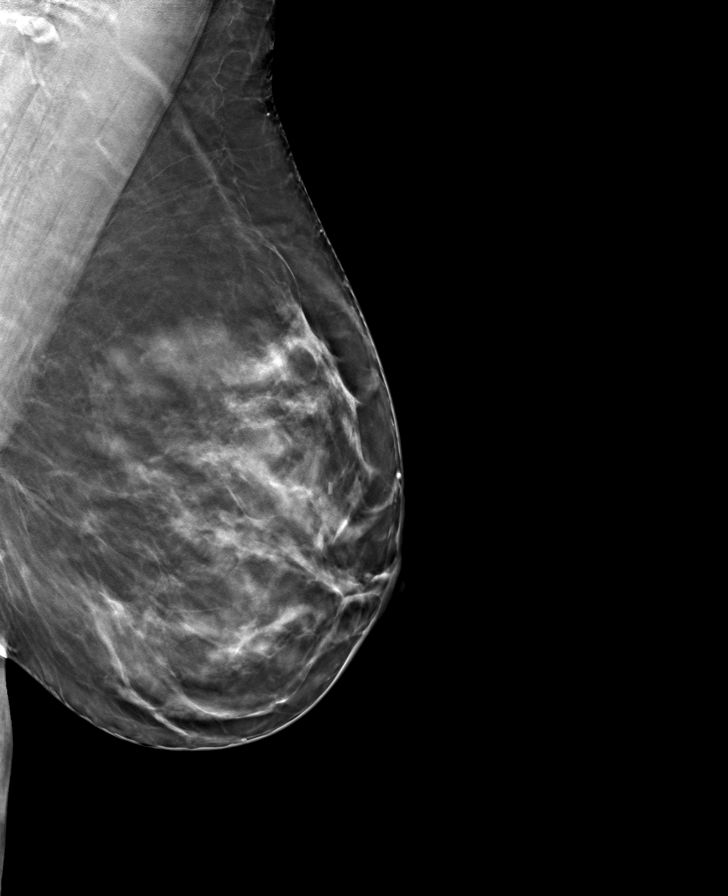

[L CC tomo · tomo slice 41/80.0]
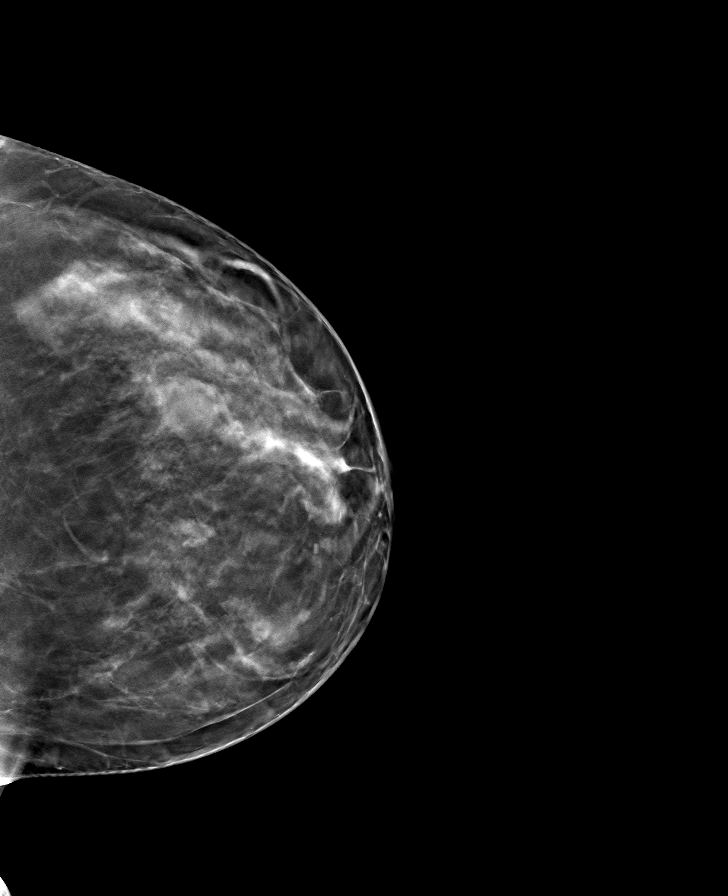

[8 of 24 positions shown; findings below may reference images not displayed]

ACR Breast Density Category c: The breast tissue is heterogeneously
dense, which may obscure small masses.
FINDINGS: There are no findings suspicious for malignancy.
IMPRESSION: No mammographic evidence of malignancy. A result letter of this
screening mammogram will be mailed directly to the patient.

RECOMMENDATION:
Screening mammogram in one year. (Code:Q3-W-BC3)

BI-RADS CATEGORY  1: Negative.
